# Patient Record
Sex: Male | Born: 1955 | Hispanic: No | Smoking: Former smoker
Health system: Southern US, Community
[De-identification: ages and names within clinical notes are randomized; demographics above are authoritative.]

## PROBLEM LIST (undated history)

## (undated) DIAGNOSIS — M1990 Unspecified osteoarthritis, unspecified site: Secondary | ICD-10-CM

## (undated) DIAGNOSIS — I447 Left bundle-branch block, unspecified: Secondary | ICD-10-CM

## (undated) HISTORY — PX: KNEE SURGERY: SHX244

## (undated) HISTORY — DX: Left bundle-branch block, unspecified: I44.7

## (undated) HISTORY — PX: ANKLE SURGERY: SHX546

## (undated) HISTORY — PX: OTHER SURGICAL HISTORY: SHX169

---

## 2013-02-02 ENCOUNTER — Other Ambulatory Visit: Payer: Self-pay | Admitting: Orthopedic Surgery

## 2013-02-13 ENCOUNTER — Encounter (HOSPITAL_COMMUNITY): Payer: Self-pay | Admitting: Pharmacy Technician

## 2013-02-17 NOTE — Pre-Procedure Instructions (Signed)
Johnny Brooks  02/17/2013   Your procedure is scheduled on:  December 1  Report to [facility] Entrance "A" [address] at [employer] AM.  Call this number if you have problems the morning of surgery: [phone]   Remember:   Do not eat food or drink liquids after midnight.   Take these medicines the morning of surgery with A SIP OF WATER: Flonase, Claritin   STOP Aspirin, CO Q10, Advil, Testosterone Supplement, Prostate Vitamin today   STOP/ Do not take Aspirin, Aleve, Naproxen, Advil, Ibuprofen, Vitamin, Herbs, and Supplements starting today   Do not wear jewelry, make-up or nail polish.  Do not wear lotions, powders, or perfumes. You may wear deodorant.  Do not shave 48 hours prior to surgery. Men may shave face and neck.  Do not bring valuables to the hospital.  [facility] is not responsible                  for any belongings or valuables.               Contacts, dentures or bridgework may not be worn into surgery.  Leave suitcase in the car. After surgery it may be brought to your room.  For patients admitted to the hospital, discharge time is determined by your                treatment team.               Special Instructions: Shower using CHG 2 nights before surgery and the night before surgery.  If you shower the day of surgery use CHG.  Use special wash - you have one bottle of CHG for all showers.  You should use approximately 1/3 of the bottle for each shower.   Please read over the following fact sheets that you were given: Pain Booklet, Coughing and Deep Breathing, Blood Transfusion Information, Total Joint Packet and Surgical Site Infection Prevention

## 2013-02-19 ENCOUNTER — Encounter (HOSPITAL_COMMUNITY): Payer: Self-pay

## 2013-02-19 ENCOUNTER — Encounter (HOSPITAL_COMMUNITY)
Admission: RE | Admit: 2013-02-19 | Discharge: 2013-02-19 | Disposition: A | Discharge status: Home / Self Care | Payer: 59 | Source: Ambulatory Visit | Attending: Orthopedic Surgery | Admitting: Orthopedic Surgery

## 2013-02-19 ENCOUNTER — Encounter (HOSPITAL_COMMUNITY)
Admission: RE | Admit: 2013-02-19 | Discharge: 2013-02-19 | Disposition: A | Discharge status: Home / Self Care | Payer: 59 | Source: Ambulatory Visit | Attending: Anesthesiology | Admitting: Anesthesiology

## 2013-02-19 HISTORY — DX: Unspecified osteoarthritis, unspecified site: M19.90

## 2013-02-19 LAB — BASIC METABOLIC PANEL
BUN: 10 mg/dL (ref 6–23)
Calcium: 8.9 mg/dL (ref 8.4–10.5)
Creatinine, Ser: 0.96 mg/dL (ref 0.50–1.35)
GFR calc Af Amer: >90 mL/min (ref >90)
GFR calc non Af Amer: 90 mL/min — ABNORMAL LOW (ref >90)
Glucose, Bld: 90 mg/dL (ref 70–99)
Sodium: 139 mEq/L (ref 135–145)

## 2013-02-19 LAB — CBC WITH DIFFERENTIAL/PLATELET
Basophils Relative: 1 % (ref 0–1)
Eosinophils Absolute: 0.1 10*3/uL (ref 0.0–0.7)
Hemoglobin: 13.3 g/dL (ref 13.0–17.0)
Lymphs Abs: 2.2 10*3/uL (ref 0.7–4.0)
MCH: 34 pg (ref 26.0–34.0)
MCHC: 35.6 g/dL (ref 30.0–36.0)
Monocytes Relative: 10 % (ref 3–12)
Neutro Abs: 2.4 10*3/uL (ref 1.7–7.7)
Neutrophils Relative %: 45 % (ref 43–77)
Platelets: 220 10*3/uL (ref 150–400)

## 2013-02-19 LAB — URINALYSIS, ROUTINE W REFLEX MICROSCOPIC
Bilirubin Urine: NEGATIVE
Glucose, UA: NEGATIVE mg/dL
Hgb urine dipstick: NEGATIVE
Leukocytes, UA: NEGATIVE
Specific Gravity, Urine: 1.016 (ref 1.005–1.030)
Urobilinogen, UA: 1 mg/dL (ref 0.0–1.0)

## 2013-02-19 LAB — SURGICAL PCR SCREEN
MRSA, PCR: NEGATIVE
Staphylococcus aureus: NEGATIVE

## 2013-02-19 LAB — PROTIME-INR: Prothrombin Time: 12.6 seconds (ref 11.6–15.2)

## 2013-02-19 LAB — ABO/RH: ABO/RH(D): A NEG

## 2013-02-19 NOTE — Progress Notes (Signed)
Left message for "Elease Hashimoto" at [place] [facility] [place]--[phone] to see if i can obtain earlier EKG for comparison.  DA

## 2013-02-19 NOTE — Progress Notes (Addendum)
Anesthesia Chart Review:  Patient is a 58 year old male scheduled for bilateral TKA on 02/26/13 by Dr. Turner Daniels.  History includes obesity (BMI 38), smoking, arthritis, right ankle surgery, left knee surgery '73 with prior arthroscopies, right dislocated clavicle. No documented HTN, but BP was 143/95 at PAT. OSA screening score was 4. PCP is Dr. Kirby Funk.    EKG on 02/19/13 showed NSR, LAD, left BBB.  There are no prior comparison EKGs in [place], Epic, or at Dr. Jone Baseman office.  CXR showed no acute abnormality seen.  Preoperative labs noted.  Cr 0.96, glucose 90, H/H 13.3/37.4, PT/PTT WNL.  Patient scheduled for bilateral TKA and has left BBB on unknown duration. He will need preoperative cardiology evaluation.  Discussed with anesthesiologist Dr. Noreene Larsson.  Agustin Cree notified at Dr. Wadie Lessen office.  She will work on getting cardiology appointment arranged.  Velna Ochs [facility] Short Stay Center/Anesthesiology Phone [phone] 02/19/2013 3:56 PM  Addendum: 02/21/2013 3:37 PM Patient was seen by cardiologist Dr. Allyson Sabal on 02/20/13.  He had a nuclear stress test today that showed: Normal stress nuclear study. LV Wall Motion: Incoordinate septal motion from LBBB. EF 61%. This was reviewed by Dr. Rennis Golden who cleared patient for upcoming knee surgery.

## 2013-02-19 NOTE — Progress Notes (Signed)
02/19/13 1030  OBSTRUCTIVE SLEEP APNEA  Have you ever been diagnosed with sleep apnea through a sleep study? No  Do you snore loudly (loud enough to be heard through closed doors)?  0  Do you often feel tired, fatigued, or sleepy during the daytime? 0  Has anyone observed you stop breathing during your sleep? 0  Do you have, or are you being treated for high blood pressure? 0  BMI more than 35 kg/m2? 1  Age over 58 years old? 1  Neck circumference greater than 40 cm/18 inches? 1  Gender: 1  Obstructive Sleep Apnea Score 4  Score 4 or greater  Results sent to PCP

## 2013-02-20 ENCOUNTER — Encounter: Payer: Self-pay | Admitting: Cardiovascular Disease

## 2013-02-20 ENCOUNTER — Ambulatory Visit (INDEPENDENT_AMBULATORY_CARE_PROVIDER_SITE_OTHER): Payer: 59 | Admitting: Cardiovascular Disease

## 2013-02-20 VITALS — BP 116/81 | HR 95 | Ht 72.0 in | Wt 282.0 lb

## 2013-02-20 DIAGNOSIS — Z01818 Encounter for other preprocedural examination: Secondary | ICD-10-CM

## 2013-02-20 DIAGNOSIS — I447 Left bundle-branch block, unspecified: Secondary | ICD-10-CM

## 2013-02-20 NOTE — Assessment & Plan Note (Signed)
Newly diagnosed. No prior EKG to compare. No critical factors. Scheduled for bilateral total knee replacements. I couldn't get any pharmacologic Myoview stress test to rule out ischemic etiology.

## 2013-02-20 NOTE — Progress Notes (Signed)
02/20/2013 [name]   07/23/1955  [id number]  Primary Physician Lillia Mountain, MD Primary Cardiologist: Runell Gess MD Roseanne Reno   HPI:  Mr. Johnny Brooks is a delightful 56 year old moderately overweight married Caucasian male father of 2, grandfather to 4 grandchildren he works as a Naval architect at [employer] . His primary care physician is Kirby Funk. He is scheduled for bilateral total knee replacements by Dr. Turner Daniels early next week. He has no cardiac risk factors  And is otherwise without symptoms. He he has had 5-6 knee surgeries in the past. During a preoperative evaluation he was noted to have a left bundle branch block and there are no EKGs with which to compare.   Current Outpatient Prescriptions  Medication Sig Dispense Refill  . aspirin EC 325 MG tablet Take 650 mg by mouth every 4 (four) hours as needed for mild pain.      . Coenzyme Q10 (CO Q-10 PO) Take 1 tablet by mouth daily.      . fluticasone (FLONASE) 50 MCG/ACT nasal spray Place 1 spray into both nostrils daily as needed for allergies or rhinitis.      Marland Kitchen ibuprofen (ADVIL,MOTRIN) 200 MG tablet Take 400 mg by mouth every 6 (six) hours as needed for fever, headache or moderate pain.      Marland Kitchen loratadine (CLARITIN) 10 MG tablet Take 10 mg by mouth daily as needed for allergies.      Marland Kitchen OVER THE COUNTER MEDICATION Take 2 capsules by mouth daily. Testosterone supplement      . Specialty Vitamins Products (PROSTATE PO) Take 1 tablet by mouth daily.      . TURMERIC PO Take by mouth.       No current facility-administered medications for this visit.    No Known Allergies  History   Social History  . Marital Status: Married    Spouse Name: N/A    Number of Children: N/A  . Years of Education: N/A   Occupational History  . Not on file.   Social History Main Topics  . Smoking status: Light Tobacco Smoker    Types: Cigars  . Smokeless tobacco: Not on file  . Alcohol Use: 1.8 oz/week    3  Cans of beer per week  . Drug Use: No  . Sexual Activity: Not on file   Other Topics Concern  . Not on file   Social History Narrative  . No narrative on file     Review of Systems: General: negative for chills, fever, night sweats or weight changes.  Cardiovascular: negative for chest pain, dyspnea on exertion, edema, orthopnea, palpitations, paroxysmal nocturnal dyspnea or shortness of breath Dermatological: negative for rash Respiratory: negative for cough or wheezing Urologic: negative for hematuria Abdominal: negative for nausea, vomiting, diarrhea, bright red blood per rectum, melena, or hematemesis Neurologic: negative for visual changes, syncope, or dizziness All other systems reviewed and are otherwise negative except as noted above.    Blood pressure 116/81, pulse 95, height 6' (1.829 m), weight 282 lb (127.914 kg).  General appearance: alert and no distress Neck: no adenopathy, no carotid bruit, no JVD, supple, symmetrical, trachea midline and thyroid not enlarged, symmetric, no tenderness/mass/nodules Lungs: clear to auscultation bilaterally Heart: regular rate and rhythm, S1, S2 normal, no murmur, click, rub or gallop Abdomen: soft, non-tender; bowel sounds normal; no masses,  no organomegaly Extremities: extremities normal, atraumatic, no cyanosis or edema Pulses: 2+ and symmetric  EKG not performed today outside EKG performed  yesterday revealed sinus rhythm at 81 with left bundle branch block  ASSESSMENT AND PLAN:   Left bundle branch block Newly diagnosed. No prior EKG to compare. No critical factors. Scheduled for bilateral total knee replacements. I couldn't get any pharmacologic Myoview stress test to rule out ischemic etiology.      Runell Gess MD FACP,FACC,FAHA, [facility] 02/20/2013 10:55 AM

## 2013-02-20 NOTE — Patient Instructions (Signed)
  We will see you back in follow up as needed with Dr Allyson Sabal  Dr Allyson Sabal has ordered a lexiscan myoview to be done tomorrow

## 2013-02-21 ENCOUNTER — Encounter (HOSPITAL_COMMUNITY): Payer: Self-pay

## 2013-02-21 ENCOUNTER — Telehealth: Payer: Self-pay | Admitting: *Deleted

## 2013-02-21 ENCOUNTER — Ambulatory Visit (HOSPITAL_COMMUNITY)
Admission: RE | Admit: 2013-02-21 | Discharge: 2013-02-21 | Disposition: A | Discharge status: Home / Self Care | Payer: 59 | Source: Ambulatory Visit | Attending: Cardiovascular Disease | Admitting: Cardiovascular Disease

## 2013-02-21 DIAGNOSIS — E663 Overweight: Secondary | ICD-10-CM | POA: Insufficient documentation

## 2013-02-21 DIAGNOSIS — R0989 Other specified symptoms and signs involving the circulatory and respiratory systems: Secondary | ICD-10-CM | POA: Insufficient documentation

## 2013-02-21 DIAGNOSIS — R0609 Other forms of dyspnea: Secondary | ICD-10-CM | POA: Insufficient documentation

## 2013-02-21 DIAGNOSIS — Z01818 Encounter for other preprocedural examination: Secondary | ICD-10-CM

## 2013-02-21 DIAGNOSIS — F17200 Nicotine dependence, unspecified, uncomplicated: Secondary | ICD-10-CM | POA: Insufficient documentation

## 2013-02-21 DIAGNOSIS — R9431 Abnormal electrocardiogram [ECG] [EKG]: Secondary | ICD-10-CM | POA: Insufficient documentation

## 2013-02-21 DIAGNOSIS — I447 Left bundle-branch block, unspecified: Secondary | ICD-10-CM

## 2013-02-21 MED ORDER — TECHNETIUM TC 99M SESTAMIBI GENERIC - CARDIOLITE
10.0000 | Freq: Once | INTRAVENOUS | Status: AC | PRN
  Administered 2013-02-21: 10 via INTRAVENOUS

## 2013-02-21 MED ORDER — TECHNETIUM TC 99M SESTAMIBI GENERIC - CARDIOLITE
30.0000 | Freq: Once | INTRAVENOUS | Status: AC | PRN
  Administered 2013-02-21: 30 via INTRAVENOUS

## 2013-02-21 MED ORDER — REGADENOSON 0.4 MG/5ML IV SOLN
0.4000 mg | Freq: Once | INTRAVENOUS | Status: AC
  Administered 2013-02-21: 0.4 mg via INTRAVENOUS

## 2013-02-21 NOTE — Procedures (Addendum)
[facility] [address] [place] [address] [place] [address] [phone]  Cardiology Nuclear Med Study  Johnny Brooks is a 57 y.o. male     MRN : [id number]     DOB: 04/20/1955  Procedure Date: 02/21/2013  Nuclear Med Background Indication for Stress Test:  Surgical Clearance and Abnormal EKG History:  Pt denies any prior cardiac or respiratory history. Cardiac Risk Factors: LBBB, Overweight and Smoker  Symptoms:  DOE   Nuclear Pre-Procedure Caffeine/Decaff Intake:  9:00pm NPO After: 7:00am   IV Site: R Hand  IV 0.9% NS with Angio Cath:  22g  Chest Size (in):  46"  IV Started by: Emmit Pomfret, RN  Height: 6' (1.829 m)  Cup Size: n/a  BMI:  Body mass index is 38.24 kg/(m^2). Weight:  282 lb (127.914 kg)   Tech Comments:  n/a    Nuclear Med Study 1 or 2 day study: 1 day  Stress Test Type:  Lexiscan  Order Authorizing Provider:  Obie Dredge   Resting Radionuclide: Technetium 54m Sestamibi  Resting Radionuclide Dose: 10.2 mCi   Stress Radionuclide:  Technetium 50m Sestamibi  Stress Radionuclide Dose: 30.7 mCi           Stress Protocol Rest HR: 81 Stress HR: 106  Rest BP: 146/92 Stress BP: 152/90  Exercise Time (min): n/a METS: n/a   Predicted Max HR: 162 bpm % Max HR: 66.67 bpm Rate Pressure Product: 21308  Dose of Adenosine (mg):  n/a Dose of Lexiscan: 0.4 mg  Dose of Atropine (mg): n/a Dose of Dobutamine: n/a mcg/kg/min (at max HR)  Stress Test Technologist: Esperanza Sheets, CCT Nuclear Technologist: Koren Shiver, CNMT   Rest Procedure:  Myocardial perfusion imaging was performed at rest 45 minutes following the intravenous administration of Technetium 73m Sestamibi. Stress Procedure:  The patient received IV Lexiscan 0.4 mg over 15-seconds.  Technetium 63m Sestamibi injected at 30-seconds.  There were no significant changes with Lexiscan.  Quantitative spect images were obtained after a 45 minute  delay.  Transient Ischemic Dilatation (Normal <1.22):  1.00 Lung/Heart Ratio (Normal <0.45):  0.30 QGS EDV:  90 ml QGS ESV:  35 ml LV Ejection Fraction: 61%  Rest ECG: NSR-LBBB  Stress ECG: No significant change from baseline ECG  QPS Raw Data Images:  Normal; no motion artifact; normal heart/lung ratio. Stress Images:  Normal homogeneous uptake in all areas of the myocardium. Rest Images:  Normal homogeneous uptake in all areas of the myocardium. Subtraction (SDS):  No evidence of ischemia.  Impression Exercise Capacity:  Lexiscan with no exercise. BP Response:  Normal blood pressure response. Clinical Symptoms:  No symptoms. ECG Impression:  No significant ECG changes with Lexiscan. Comparison with Prior Nuclear Study: No previous nuclear study performed  Overall Impression:  Normal stress nuclear study.  LV Wall Motion:  Incoordinate septal motion from LBBB. EF 61%.  Chrystie Nose, MD, [facility] Board Certified in Nuclear Cardiology Attending Cardiologist [facility]  Chrystie Nose, MD  02/21/2013 12:41 PM

## 2013-02-21 NOTE — Telephone Encounter (Signed)
Dr Rennis Golden read the [facility] and cleared patient for upcoming knee surgery on Monday.  Patient is aware and clearance letter was faxed.

## 2013-02-21 NOTE — H&P (Signed)
TOTAL KNEE ADMISSION H&P  Patient is being admitted for bilaterally total knee arthroplasty.  Subjective:  Chief Complaint:bilaterally knee pain.  HPI: Johnny Brooks, 57 y.o. male, has a history of pain and functional disability in the bilaterally knee due to arthritis and has failed non-surgical conservative treatments for greater than 12 weeks to includeNSAID's and/or analgesics, corticosteriod injections, flexibility and strengthening excercises and activity modification.  Onset of symptoms was gradual, starting several years ago with gradually worsening course since that time. The patient noted no past surgery on the bilaterally knee(s).  Patient currently rates pain in the bilaterally knee(s) at 10 out of 10 with activity. Patient has night pain, worsening of pain with activity and weight bearing, pain with passive range of motion and joint swelling.  Patient has evidence of joint space narrowing by imaging studies. There is no active infection.  Patient Active Problem List   Diagnosis Date Noted  . Left bundle branch block 02/20/2013   Past Medical History  Diagnosis Date  . Arthritis     knees  . Left bundle branch block     Past Surgical History  Procedure Laterality Date  . Knee surgery      left  1973  . Arthroscopies      numerous on knees  . Dislocated clavicle      right  . Ankle surgery      right    No prescriptions prior to admission   No Known Allergies  History  Substance Use Topics  . Smoking status: Light Tobacco Smoker    Types: Cigars  . Smokeless tobacco: Not on file  . Alcohol Use: 1.8 oz/week    3 Cans of beer per week    No family history on file.   Review of Systems  Constitutional: Negative.   HENT: Negative.   Eyes: Negative.   Respiratory: Negative.   Cardiovascular: Negative.   Gastrointestinal: Negative.   Genitourinary: Negative.   Musculoskeletal: Positive for joint pain.  Skin: Negative.   Neurological: Negative.    Endo/Heme/Allergies: Negative.   Psychiatric/Behavioral: Negative.     Objective:  Physical Exam  Constitutional: He is oriented to person, place, and time. He appears well-developed and well-nourished.  HENT:  Head: Normocephalic and atraumatic.  Eyes: Pupils are equal, round, and reactive to light.  Neck: Normal range of motion. Neck supple.  Cardiovascular: Intact distal pulses.   Respiratory: Effort normal.  Musculoskeletal:  Patient has good strength bilaterally in his knees.  He does have reduced range of motion from approximately 5 to 120 bilaterally.  Patient does have increased medial joint line tenderness and mild peripatellar pain bilaterally.  Obvious crepitus with range of motion.  No noticeable instability.  Patient's calves are soft and nontender.  He is neurovascularly intact distally.  Neurological: He is alert and oriented to person, place, and time.  Skin: Skin is warm and dry.  Psychiatric: He has a normal mood and affect. His behavior is normal. Judgment and thought content normal.    Vital signs in last 24 hours: Weight:  [127.914 kg (282 lb)] 127.914 kg (282 lb) (11/26 0943)  Labs:   There is no height or weight on file to calculate BMI.   Imaging Review X-rays are reviewed in office today and show bilateral medial joint space collapse with near bone-on-bone arthritis.  Patient also has obvious patellofemoral arthritis with spurring.  Assessment/Plan:  End stage arthritis, bilaterally knee   The patient history, physical examination, clinical judgment of the  provider and imaging studies are consistent with end stage degenerative joint disease of the bilaterally knee(s) and total knee arthroplasty is deemed medically necessary. The treatment options including medical management, injection therapy arthroscopy and arthroplasty were discussed at length. The risks and benefits of total knee arthroplasty were presented and reviewed. The risks due to aseptic  loosening, infection, stiffness, patella tracking problems, thromboembolic complications and other imponderables were discussed. The patient acknowledged the explanation, agreed to proceed with the plan and consent was signed. Patient is being admitted for inpatient treatment for surgery, pain control, PT, OT, prophylactic antibiotics, VTE prophylaxis, progressive ambulation and ADL's and discharge planning. The patient is planning to be discharged to skilled nursing facility

## 2013-02-25 MED ORDER — DEXTROSE 5 % IV SOLN
3.0000 g | INTRAVENOUS | Status: AC
  Administered 2013-02-26 (×2): 3 g via INTRAVENOUS
  Filled 2013-02-25: qty 3000

## 2013-02-26 ENCOUNTER — Encounter (HOSPITAL_COMMUNITY)
Admission: RE | Disposition: A | Discharge status: Skilled Nursing Facility | Payer: Self-pay | Source: Ambulatory Visit | Attending: Orthopedic Surgery

## 2013-02-26 ENCOUNTER — Encounter (HOSPITAL_COMMUNITY): Payer: 59 | Admitting: Vascular Surgery

## 2013-02-26 ENCOUNTER — Inpatient Hospital Stay (HOSPITAL_COMMUNITY): Payer: 59 | Admitting: Anesthesiology

## 2013-02-26 ENCOUNTER — Encounter (HOSPITAL_COMMUNITY): Payer: Self-pay | Admitting: *Deleted

## 2013-02-26 ENCOUNTER — Inpatient Hospital Stay (HOSPITAL_COMMUNITY)
Admission: RE | Admit: 2013-02-26 | Discharge: 2013-03-02 | DRG: 462 | Disposition: A | Discharge status: Skilled Nursing Facility | Payer: 59 | Source: Ambulatory Visit | Attending: Orthopedic Surgery | Admitting: Orthopedic Surgery

## 2013-02-26 DIAGNOSIS — I447 Left bundle-branch block, unspecified: Secondary | ICD-10-CM | POA: Diagnosis present

## 2013-02-26 DIAGNOSIS — D62 Acute posthemorrhagic anemia: Secondary | ICD-10-CM | POA: Diagnosis not present

## 2013-02-26 DIAGNOSIS — M1710 Unilateral primary osteoarthritis, unspecified knee: Principal | ICD-10-CM | POA: Diagnosis present

## 2013-02-26 DIAGNOSIS — E669 Obesity, unspecified: Secondary | ICD-10-CM | POA: Diagnosis present

## 2013-02-26 DIAGNOSIS — Z6838 Body mass index (BMI) 38.0-38.9, adult: Secondary | ICD-10-CM

## 2013-02-26 DIAGNOSIS — F17200 Nicotine dependence, unspecified, uncomplicated: Secondary | ICD-10-CM | POA: Diagnosis present

## 2013-02-26 HISTORY — PX: TOTAL KNEE ARTHROPLASTY: SHX125

## 2013-02-26 SURGERY — ARTHROPLASTY, KNEE, BILATERAL, TOTAL
Anesthesia: General | Site: Knee | Laterality: Bilateral | Wound class: Clean

## 2013-02-26 MED ORDER — MENTHOL 3 MG MT LOZG: 1.0000 | LOZENGE | OROMUCOSAL | Status: DC | PRN

## 2013-02-26 MED ORDER — NEOSTIGMINE METHYLSULFATE 1 MG/ML IJ SOLN
INTRAMUSCULAR | Status: DC | PRN
  Administered 2013-02-26: 5 mg via INTRAVENOUS

## 2013-02-26 MED ORDER — METOCLOPRAMIDE HCL 5 MG/ML IJ SOLN: 5.0000 mg | Freq: Three times a day (TID) | INTRAMUSCULAR | Status: DC | PRN

## 2013-02-26 MED ORDER — LIDOCAINE HCL (CARDIAC) 20 MG/ML IV SOLN
INTRAVENOUS | Status: DC | PRN
  Administered 2013-02-26: 100 mg via INTRAVENOUS

## 2013-02-26 MED ORDER — OXYCODONE HCL 5 MG PO TABS
5.0000 mg | ORAL_TABLET | ORAL | Status: DC | PRN
  Administered 2013-02-26 (×2): 10 mg via ORAL
  Administered 2013-02-26: 5 mg via ORAL
  Administered 2013-02-27 – 2013-03-02 (×19): 10 mg via ORAL
  Filled 2013-02-26 (×24): qty 2

## 2013-02-26 MED ORDER — ACETAMINOPHEN 650 MG RE SUPP: 650.0000 mg | Freq: Four times a day (QID) | RECTAL | Status: DC | PRN

## 2013-02-26 MED ORDER — MAGNESIUM CITRATE PO SOLN: 1.0000 | Freq: Once | ORAL | Status: AC | PRN

## 2013-02-26 MED ORDER — HYDROMORPHONE HCL PF 1 MG/ML IJ SOLN
1.0000 mg | INTRAMUSCULAR | Status: DC | PRN
  Administered 2013-02-26 – 2013-03-01 (×14): 1 mg via INTRAVENOUS
  Filled 2013-02-26 (×14): qty 1

## 2013-02-26 MED ORDER — FENTANYL CITRATE 0.05 MG/ML IJ SOLN
INTRAMUSCULAR | Status: DC | PRN
  Administered 2013-02-26 (×15): 50 ug via INTRAVENOUS

## 2013-02-26 MED ORDER — GLYCOPYRROLATE 0.2 MG/ML IJ SOLN
INTRAMUSCULAR | Status: DC | PRN
  Administered 2013-02-26: .8 mg via INTRAVENOUS

## 2013-02-26 MED ORDER — KCL IN DEXTROSE-NACL 20-5-0.45 MEQ/L-%-% IV SOLN
INTRAVENOUS | Status: AC
  Filled 2013-02-26: qty 1000

## 2013-02-26 MED ORDER — BUPIVACAINE LIPOSOME 1.3 % IJ SUSP
20.0000 mL | Freq: Once | INTRAMUSCULAR | Status: DC
  Filled 2013-02-26: qty 20

## 2013-02-26 MED ORDER — FENTANYL CITRATE 0.05 MG/ML IJ SOLN
INTRAMUSCULAR | Status: AC
  Filled 2013-02-26: qty 2

## 2013-02-26 MED ORDER — OXYCODONE HCL 5 MG/5ML PO SOLN
5.0000 mg | Freq: Once | ORAL | Status: DC | PRN
Start: 2013-02-26 — End: 2013-02-26

## 2013-02-26 MED ORDER — SODIUM CHLORIDE 0.9 % IR SOLN
Status: DC | PRN
  Administered 2013-02-26: 3000 mL
  Administered 2013-02-26: 1000 mL

## 2013-02-26 MED ORDER — DEXAMETHASONE SODIUM PHOSPHATE 10 MG/ML IJ SOLN
INTRAMUSCULAR | Status: DC | PRN
  Administered 2013-02-26: 8 mg via INTRAVENOUS

## 2013-02-26 MED ORDER — METOCLOPRAMIDE HCL 10 MG PO TABS: 5.0000 mg | ORAL_TABLET | Freq: Three times a day (TID) | ORAL | Status: DC | PRN

## 2013-02-26 MED ORDER — ONDANSETRON HCL 4 MG/2ML IJ SOLN
INTRAMUSCULAR | Status: DC | PRN
  Administered 2013-02-26 (×2): 4 mg via INTRAVENOUS

## 2013-02-26 MED ORDER — CEFUROXIME SODIUM 1.5 G IJ SOLR
INTRAMUSCULAR | Status: DC | PRN
  Administered 2013-02-26: 3 g

## 2013-02-26 MED ORDER — LORATADINE 10 MG PO TABS
10.0000 mg | ORAL_TABLET | Freq: Every day | ORAL | Status: DC | PRN
  Filled 2013-02-26: qty 1

## 2013-02-26 MED ORDER — ONDANSETRON HCL 4 MG PO TABS: 4.0000 mg | ORAL_TABLET | Freq: Four times a day (QID) | ORAL | Status: DC | PRN

## 2013-02-26 MED ORDER — LIDOCAINE HCL 4 % MT SOLN
OROMUCOSAL | Status: DC | PRN
  Administered 2013-02-26: 4 mL via TOPICAL

## 2013-02-26 MED ORDER — KCL IN DEXTROSE-NACL 20-5-0.45 MEQ/L-%-% IV SOLN
INTRAVENOUS | Status: DC
  Administered 2013-02-26: 17:00:00 via INTRAVENOUS
  Administered 2013-02-27: 1 mL via INTRAVENOUS
  Filled 2013-02-26 (×14): qty 1000

## 2013-02-26 MED ORDER — LACTATED RINGERS IV SOLN
INTRAVENOUS | Status: DC | PRN
  Administered 2013-02-26 (×3): via INTRAVENOUS

## 2013-02-26 MED ORDER — PROPOFOL 10 MG/ML IV BOLUS
INTRAVENOUS | Status: DC | PRN
  Administered 2013-02-26 (×3): 10 mg via INTRAVENOUS
  Administered 2013-02-26: 30 mg via INTRAVENOUS
  Administered 2013-02-26: 70 mg via INTRAVENOUS
  Administered 2013-02-26: 10 mg via INTRAVENOUS
  Administered 2013-02-26: 200 mg via INTRAVENOUS
  Administered 2013-02-26: 10 mg via INTRAVENOUS

## 2013-02-26 MED ORDER — FENTANYL CITRATE 0.05 MG/ML IJ SOLN
25.0000 ug | INTRAMUSCULAR | Status: DC | PRN
  Administered 2013-02-26 (×3): 50 ug via INTRAVENOUS

## 2013-02-26 MED ORDER — FLUTICASONE PROPIONATE 50 MCG/ACT NA SUSP: 1.0000 | Freq: Every day | NASAL | Status: DC | PRN

## 2013-02-26 MED ORDER — ROCURONIUM BROMIDE 100 MG/10ML IV SOLN
INTRAVENOUS | Status: DC | PRN
  Administered 2013-02-26: 10 mg via INTRAVENOUS
  Administered 2013-02-26: 50 mg via INTRAVENOUS
  Administered 2013-02-26 (×2): 20 mg via INTRAVENOUS

## 2013-02-26 MED ORDER — ASPIRIN EC 325 MG PO TBEC: 325.0000 mg | DELAYED_RELEASE_TABLET | Freq: Two times a day (BID) | ORAL | Status: DC

## 2013-02-26 MED ORDER — CEFAZOLIN SODIUM 1-5 GM-% IV SOLN
INTRAVENOUS | Status: AC
  Filled 2013-02-26: qty 50

## 2013-02-26 MED ORDER — SENNOSIDES-DOCUSATE SODIUM 8.6-50 MG PO TABS: 1.0000 | ORAL_TABLET | Freq: Every evening | ORAL | Status: DC | PRN

## 2013-02-26 MED ORDER — METHOCARBAMOL 100 MG/ML IJ SOLN
500.0000 mg | Freq: Four times a day (QID) | INTRAMUSCULAR | Status: DC | PRN
  Filled 2013-02-26: qty 5

## 2013-02-26 MED ORDER — OXYCODONE HCL 5 MG PO TABS: 5.0000 mg | ORAL_TABLET | Freq: Once | ORAL | Status: DC | PRN

## 2013-02-26 MED ORDER — DOCUSATE SODIUM 100 MG PO CAPS
100.0000 mg | ORAL_CAPSULE | Freq: Two times a day (BID) | ORAL | Status: DC
  Administered 2013-02-26 – 2013-03-02 (×9): 100 mg via ORAL
  Filled 2013-02-26 (×9): qty 1

## 2013-02-26 MED ORDER — ONDANSETRON HCL 4 MG/2ML IJ SOLN: 4.0000 mg | Freq: Four times a day (QID) | INTRAMUSCULAR | Status: DC | PRN

## 2013-02-26 MED ORDER — BISACODYL 5 MG PO TBEC: 5.0000 mg | DELAYED_RELEASE_TABLET | Freq: Every day | ORAL | Status: DC | PRN

## 2013-02-26 MED ORDER — PHENOL 1.4 % MT LIQD: 1.0000 | OROMUCOSAL | Status: DC | PRN

## 2013-02-26 MED ORDER — DEXTROSE-NACL 5-0.45 % IV SOLN: INTRAVENOUS | Status: DC

## 2013-02-26 MED ORDER — OXYCODONE-ACETAMINOPHEN 5-325 MG PO TABS: 1.0000 | ORAL_TABLET | ORAL | Status: DC | PRN

## 2013-02-26 MED ORDER — CHLORHEXIDINE GLUCONATE 4 % EX LIQD: 60.0000 mL | Freq: Once | CUTANEOUS | Status: DC

## 2013-02-26 MED ORDER — MIDAZOLAM HCL 5 MG/5ML IJ SOLN
INTRAMUSCULAR | Status: DC | PRN
  Administered 2013-02-26: 2 mg via INTRAVENOUS

## 2013-02-26 MED ORDER — ASPIRIN EC 325 MG PO TBEC
325.0000 mg | DELAYED_RELEASE_TABLET | Freq: Every day | ORAL | Status: DC
  Administered 2013-02-27 – 2013-03-02 (×4): 325 mg via ORAL
  Filled 2013-02-26 (×5): qty 1

## 2013-02-26 MED ORDER — CEFUROXIME SODIUM 1.5 G IJ SOLR
INTRAMUSCULAR | Status: AC
  Filled 2013-02-26: qty 3

## 2013-02-26 MED ORDER — ARTIFICIAL TEARS OP OINT
TOPICAL_OINTMENT | OPHTHALMIC | Status: DC | PRN
  Administered 2013-02-26: 1 via OPHTHALMIC

## 2013-02-26 MED ORDER — METHOCARBAMOL 500 MG PO TABS
500.0000 mg | ORAL_TABLET | Freq: Four times a day (QID) | ORAL | Status: DC | PRN
  Administered 2013-02-26 – 2013-03-02 (×10): 500 mg via ORAL
  Filled 2013-02-26 (×10): qty 1

## 2013-02-26 MED ORDER — SODIUM CHLORIDE 0.9 % IJ SOLN
INTRAMUSCULAR | Status: DC | PRN
  Administered 2013-02-26: 09:00:00

## 2013-02-26 MED ORDER — ACETAMINOPHEN 325 MG PO TABS
650.0000 mg | ORAL_TABLET | Freq: Four times a day (QID) | ORAL | Status: DC | PRN
  Administered 2013-02-28: 650 mg via ORAL
  Filled 2013-02-26: qty 2

## 2013-02-26 MED ORDER — HYDROMORPHONE HCL PF 1 MG/ML IJ SOLN
INTRAMUSCULAR | Status: AC
  Administered 2013-02-26: 1 mg via INTRAVENOUS
  Filled 2013-02-26: qty 1

## 2013-02-26 MED ORDER — ALUM & MAG HYDROXIDE-SIMETH 200-200-20 MG/5ML PO SUSP: 30.0000 mL | ORAL | Status: DC | PRN

## 2013-02-26 MED ORDER — METHOCARBAMOL 500 MG PO TABS: 500.0000 mg | ORAL_TABLET | Freq: Two times a day (BID) | ORAL | Status: DC

## 2013-02-26 MED ORDER — TRANEXAMIC ACID 100 MG/ML IV SOLN
1000.0000 mg | INTRAVENOUS | Status: AC
  Administered 2013-02-26: 1000 mg via INTRAVENOUS
  Filled 2013-02-26: qty 10

## 2013-02-26 MED ORDER — DIPHENHYDRAMINE HCL 12.5 MG/5ML PO ELIX: 12.5000 mg | ORAL_SOLUTION | ORAL | Status: DC | PRN

## 2013-02-26 MED ORDER — CEFAZOLIN SODIUM-DEXTROSE 2-3 GM-% IV SOLR
INTRAVENOUS | Status: AC
  Filled 2013-02-26: qty 50

## 2013-02-26 MED ORDER — ONDANSETRON HCL 4 MG/2ML IJ SOLN
4.0000 mg | Freq: Four times a day (QID) | INTRAMUSCULAR | Status: DC | PRN
  Filled 2013-02-26: qty 2

## 2013-02-26 SURGICAL SUPPLY — 60 items
BANDAGE ELASTIC 4 VELCRO ST LF (GAUZE/BANDAGES/DRESSINGS) ×4 IMPLANT
BANDAGE ELASTIC 6 VELCRO ST LF (GAUZE/BANDAGES/DRESSINGS) ×2 IMPLANT
BANDAGE ESMARK 6X9 LF (GAUZE/BANDAGES/DRESSINGS) ×1 IMPLANT
BLADE SAG 18X100X1.27 (BLADE) ×4 IMPLANT
BLADE SAW SGTL 13X75X1.27 (BLADE) ×4 IMPLANT
BLADE SURG ROTATE 9660 (MISCELLANEOUS) ×2 IMPLANT
BNDG COHESIVE 4X5 TAN NS LF (GAUZE/BANDAGES/DRESSINGS) ×2 IMPLANT
BNDG ESMARK 6X9 LF (GAUZE/BANDAGES/DRESSINGS) ×2
BOWL SMART MIX CTS (DISPOSABLE) ×4 IMPLANT
CAPT RP KNEE ×4 IMPLANT
CEMENT HV SMART SET (Cement) ×8 IMPLANT
CLOTH BEACON ORANGE TIMEOUT ST (SAFETY) ×2 IMPLANT
COVER BACK TABLE 24X17X13 BIG (DRAPES) IMPLANT
COVER SURGICAL LIGHT HANDLE (MISCELLANEOUS) ×4 IMPLANT
CUFF TOURNIQUET SINGLE 34IN LL (TOURNIQUET CUFF) ×4 IMPLANT
CUFF TOURNIQUET SINGLE 44IN (TOURNIQUET CUFF) IMPLANT
DRAPE EXTREMITY BILATERAL (DRAPE) ×2 IMPLANT
DRAPE INCISE IOBAN 66X45 STRL (DRAPES) ×4 IMPLANT
DRAPE U-SHAPE 47X51 STRL (DRAPES) ×6 IMPLANT
DURAPREP 26ML APPLICATOR (WOUND CARE) ×8 IMPLANT
ELECT REM PT RETURN 9FT ADLT (ELECTROSURGICAL) ×2
ELECTRODE REM PT RTRN 9FT ADLT (ELECTROSURGICAL) ×1 IMPLANT
EVACUATOR 1/8 PVC DRAIN (DRAIN) ×4 IMPLANT
GAUZE XEROFORM 1X8 LF (GAUZE/BANDAGES/DRESSINGS) ×2 IMPLANT
GLOVE BIO SURGEON STRL SZ7.5 (GLOVE) ×4 IMPLANT
GLOVE BIO SURGEON STRL SZ8.5 (GLOVE) ×4 IMPLANT
GLOVE BIOGEL PI IND STRL 8 (GLOVE) ×2 IMPLANT
GLOVE BIOGEL PI IND STRL 9 (GLOVE) ×1 IMPLANT
GLOVE BIOGEL PI INDICATOR 8 (GLOVE) ×2
GLOVE BIOGEL PI INDICATOR 9 (GLOVE) ×1
GOWN STRL NON-REIN LRG LVL3 (GOWN DISPOSABLE) IMPLANT
GOWN STRL REIN XL XLG (GOWN DISPOSABLE) ×8 IMPLANT
HANDPIECE INTERPULSE COAX TIP (DISPOSABLE) ×1
HOOD PEEL AWAY FACE SHEILD DIS (HOOD) ×6 IMPLANT
KIT BASIN OR (CUSTOM PROCEDURE TRAY) ×2 IMPLANT
KIT ROOM TURNOVER OR (KITS) ×2 IMPLANT
MANIFOLD NEPTUNE II (INSTRUMENTS) ×2 IMPLANT
NEEDLE SPNL 18GX3.5 QUINCKE PK (NEEDLE) ×2 IMPLANT
NS IRRIG 1000ML POUR BTL (IV SOLUTION) ×2 IMPLANT
PACK TOTAL JOINT (CUSTOM PROCEDURE TRAY) ×2 IMPLANT
PAD ARMBOARD 7.5X6 YLW CONV (MISCELLANEOUS) ×4 IMPLANT
PAD CAST 4YDX4 CTTN HI CHSV (CAST SUPPLIES) ×2 IMPLANT
PADDING CAST COTTON 4X4 STRL (CAST SUPPLIES) ×2
PADDING CAST COTTON 6X4 STRL (CAST SUPPLIES) ×2 IMPLANT
SET HNDPC FAN SPRY TIP SCT (DISPOSABLE) ×1 IMPLANT
SPONGE GAUZE 4X4 12PLY (GAUZE/BANDAGES/DRESSINGS) ×2 IMPLANT
STAPLER VISISTAT 35W (STAPLE) ×4 IMPLANT
STOCKINETTE IMPERVIOUS 9X36 MD (GAUZE/BANDAGES/DRESSINGS) ×4 IMPLANT
SUCTION FRAZIER TIP 10 FR DISP (SUCTIONS) ×2 IMPLANT
SUT VIC AB 0 CT1 27 (SUTURE) ×2
SUT VIC AB 0 CT1 27XBRD ANBCTR (SUTURE) ×2 IMPLANT
SUT VIC AB 1 CTX 36 (SUTURE) ×2
SUT VIC AB 1 CTX36XBRD ANBCTR (SUTURE) ×2 IMPLANT
SUT VIC AB 2-0 CT1 27 (SUTURE) ×2
SUT VIC AB 2-0 CT1 TAPERPNT 27 (SUTURE) ×2 IMPLANT
SYR 50ML LL SCALE MARK (SYRINGE) ×2 IMPLANT
TOWEL OR 17X24 6PK STRL BLUE (TOWEL DISPOSABLE) ×2 IMPLANT
TOWEL OR 17X26 10 PK STRL BLUE (TOWEL DISPOSABLE) ×2 IMPLANT
TRAY FOLEY CATH 14FR (SET/KITS/TRAYS/PACK) ×2 IMPLANT
WATER STERILE IRR 1000ML POUR (IV SOLUTION) ×2 IMPLANT

## 2013-02-26 NOTE — Interval H&P Note (Signed)
History and Physical Interval Note:  02/26/2013 7:10 AM  Johnny Brooks  has presented today for surgery, with the diagnosis of BILATERAL KNEE OSTEOARTHRITIS  The various methods of treatment have been discussed with the patient and family. After consideration of risks, benefits and other options for treatment, the patient has consented to  Procedure(s): TOTAL KNEE BILATERAL (Bilateral) as a surgical intervention .  The patient's history has been reviewed, patient examined, no change in status, stable for surgery.  I have reviewed the patient's chart and labs.  Questions were answered to the patient's satisfaction.     Nestor Lewandowsky

## 2013-02-26 NOTE — Op Note (Signed)
PATIENT ID:      Johnny Brooks  MRN:     [id number] DOB/AGE:    1956/02/19 / 57 y.o.       OPERATIVE REPORT    DATE OF PROCEDURE:  02/26/2013       PREOPERATIVE DIAGNOSIS:   BILATERAL KNEE OSTEOARTHRITIS      There is no weight on file to calculate BMI.                                                        POSTOPERATIVE DIAGNOSIS:   BILATERAL KNEE OSTEOARTHRITIS                                                                      PROCEDURE:  Procedure(s): TOTAL KNEE BILATERAL Using Depuy Sigma RP implantsB #5 Femur, B #5Tibia, B 10mm sigma RP bearing, B 41 Patella     SURGEON: Mckenzye Cutright J    ASSISTANT:   Eric K. [employer]   (Present and scrubbed throughout the case, critical for assistance with exposure, retraction, instrumentation, and closure.)         ANESTHESIA: GET Exparel  DRAINS: foley, 2 medium hemovac in knee   TOURNIQUET TIME: R, L   COMPLICATIONS:  None     SPECIMENS: None   INDICATIONS FOR PROCEDURE: The patient has  BILATERAL KNEE OSTEOARTHRITIS, varus deformities, XR shows bone on bone arthritis, lat subluxation of tibia. Patient has failed all conservative measures including anti-inflammatory medicines, narcotics, attempts at  exercise and weight loss, cortisone injections and viscosupplementation.  Risks and benefits of surgery have been discussed, questions answered. R knee is first  DESCRIPTION OF PROCEDURE: The patient identified by armband, received  IV antibiotics, in the holding area at [facility]. Patient taken to the operating room, appropriate anesthetic  monitors were attached, and general endotracheal anesthesia induced with  the patient in supine position, Foley catheter was inserted. Tourniquet  applied high to the operative thigh. B Lateral post and foot positioner  applied to the table, B lower extremity was then prepped and draped  in usual sterile fashion from the ankle to the tourniquet. Time-out procedure was performed.  The R limb was wrapped with an Esmarch bandage and the tourniquet inflated to 350 mmHg. We began the operation by making the anterior midline incision starting at handbreadth above the patella going over the patella 1 cm medial to and  4 cm distal to the tibial tubercle. Small bleeders in the skin and the  subcutaneous tissue identified and cauterized. Transverse retinaculum was incised and reflected medially and a medial parapatellar arthrotomy was accomplished. the patella was everted and theprepatellar fat pad resected. The superficial medial collateral  ligament was then elevated from anterior to posterior along the proximal  flare of the tibia and anterior half of the menisci resected. The knee was hyperflexed exposing bone on bone arthritis. Peripheral and notch osteophytes as well as the cruciate ligaments were then resected. We continued to  work our way around posteriorly along the proximal tibia, and externally  rotated the tibia  subluxing it out from underneath the femur. A McHale  retractor was placed through the notch and a lateral Hohmann retractor  placed, and we then drilled through the proximal tibia in line with the  axis of the tibia followed by an intramedullary guide rod and 2-degree  posterior slope cutting guide. The tibial cutting guide was pinned into place  allowing resection of 6 mm of bone medially and about 13 mm of bone  laterally because of her varus deformity. Satisfied with the tibial resection, we then  entered the distal femur 2 mm anterior to the PCL origin with the  intramedullary guide rod and applied the distal femoral cutting guide  set at 11mm, with 5 degrees of valgus. This was pinned along the  epicondylar axis. At this point, the distal femoral cut was accomplished without difficulty. We then sized for a #5R femoral component and pinned the guide in 3 degrees of external rotation.The chamfer cutting guide was pinned into place. The anterior, posterior, and  chamfer cuts were accomplished without difficulty followed by  the Sigma RP box cutting guide and the box cut. We also removed posterior osteophytes from the posterior femoral condyles. At this  time, the knee was brought into full extension. We checked our  extension and flexion gaps and found them symmetric at 10mm.  The patella thickness measured at 28 mm. We set the cutting guide at 17 and removed the posterior 11 mm  of the patella, sized for a 41 button and drilled the lollipop. The knee  was then once again hyperflexed exposing the proximal tibia. We sized for a #5 tibial base plate, applied the smokestack and the conical reamer followed by the the Delta fin keel punch. We then hammered into place the Sigma RP trial femoral component, inserted a 10-mm trial bearing, trial patellar button, and took the knee through range of motion from 0-130 degrees. No thumb pressure was required for patellar  tracking. At this point, all trial components were removed, a double batch of DePuy HV cement with 1500 mg of Zinacef was mixed and applied to all bony metallic mating surfaces except for the posterior condyles of the femur itself. In order, we  hammered into place the tibial tray and removed excess cement, the femoral component and removed excess cement, a 10-mm Sigma RP bearing  was inserted, and the knee brought to full extension with compression.  The patellar button was clamped into place, and excess cement  removed. While the cement cured the wound was irrigated out with normal saline solution pulse lavage, and medium Hemovac drains were placed from an anterolateral  approach. Ligament stability and patellar tracking were checked and found to be excellent. The parapatellar arthrotomy was closed with  running #1 Vicryl suture. The subcutaneous tissue with 0 and 2-0 undyed  Vicryl suture, and the skin with skin staples. A dressing of Xeroform,  4 x 4, dressing sponges, Webril, and Ace wrap applied.  The L limb was wrapped with an Esmarch bandage and the tourniquet inflated to 350 mmHg. We began the operation by making the anterior midline incision starting at handbreadth above the patella going over the patella 1 cm medial to and  4 cm distal to the tibial tubercle. Small bleeders in the skin and the  subcutaneous tissue identified and cauterized. Transverse retinaculum was incised and reflected medially and a medial parapatellar arthrotomy was accomplished. the patella was everted and theprepatellar fat pad resected. The superficial medial collateral  ligament was then  elevated from anterior to posterior along the proximal  flare of the tibia and anterior half of the menisci resected. The knee was hyperflexed exposing bone on bone arthritis. Peripheral and notch osteophytes as well as the cruciate ligaments were then resected. We continued to  work our way around posteriorly along the proximal tibia, and externally  rotated the tibia subluxing it out from underneath the femur. A McHale  retractor was placed through the notch and a lateral Hohmann retractor  placed, and we then drilled through the proximal tibia in line with the  axis of the tibia followed by an intramedullary guide rod and 2-degree  posterior slope cutting guide. The tibial cutting guide was pinned into place  allowing resection of 6 mm of bone medially and about 12 mm of bone  laterally because of her varus deformity. Satisfied with the tibial resection, we then  entered the distal femur 2 mm anterior to the PCL origin with the  intramedullary guide rod and applied the distal femoral cutting guide  set at 11mm, with 5 degrees of valgus. This was pinned along the  epicondylar axis. At this point, the distal femoral cut was accomplished without difficulty. We then sized for a #5L femoral component and pinned the guide in 3 degrees of external rotation.The chamfer cutting guide was pinned into place. The anterior, posterior, and  chamfer cuts were accomplished without difficulty followed by  the Sigma RP box cutting guide and the box cut. We also removed posterior osteophytes from the posterior femoral condyles. At this  time, the knee was brought into full extension. We checked our  extension and flexion gaps and found them symmetric at 10mm.  The patella thickness measured at 26 mm. We set the cutting guide at 15 and removed the posterior 10 mm  of the patella, sized for a 41 button and drilled the lollipop. The knee  was then once again hyperflexed exposing the proximal tibia. We sized for a #5 tibial base plate, applied the smokestack and the conical reamer followed by the the Delta fin keel punch. We then hammered into place the Sigma RP trial femoral component, inserted a 10-mm trial bearing, trial patellar button, and took the knee through range of motion from 0-130 degrees. No thumb pressure was required for patellar  tracking. At this point, all trial components were removed, a double batch of DePuy HV cement with 1500 mg of Zinacef was mixed and applied to all bony metallic mating surfaces except for the posterior condyles of the femur itself. In order, we  hammered into place the tibial tray and removed excess cement, the femoral component and removed excess cement, a 10-mm Sigma RP bearing  was inserted, and the knee brought to full extension with compression.  The patellar button was clamped into place, and excess cement  removed. While the cement cured the wound was irrigated out with normal saline solution pulse lavage, and medium Hemovac drains were placed from an anterolateral  approach. Ligament stability and patellar tracking were checked and found to be excellent. The parapatellar arthrotomy was closed with  running #1 Vicryl suture. The subcutaneous tissue with 0 and 2-0 undyed  Vicryl suture, and the skin with skin staples. A dressing of Xeroform,  4 x 4, dressing sponges, Webril, and Ace wrap applied.  The patient  awakened, extubated, and taken to recovery room without difficulty.   Nestor Lewandowsky 02/26/2013, 10:43 AM    Gean Birchwood J 02/26/2013, 10:38 AM

## 2013-02-26 NOTE — Anesthesia Postprocedure Evaluation (Signed)
Anesthesia Post Note  Patient: Johnny Brooks  Procedure(s) Performed: Procedure(s) (LRB): TOTAL KNEE BILATERAL (Bilateral)  Anesthesia type: General  Patient location: PACU  Post pain: Pain level controlled and Adequate analgesia  Post assessment: Post-op Vital signs reviewed, Patient's Cardiovascular Status Stable, Respiratory Function Stable, Patent Airway and Pain level controlled  Last Vitals:  Filed Vitals:   02/26/13 1129  BP: 143/93  Pulse: 111  Temp: 36.9 C  Resp: 24    Post vital signs: Reviewed and stable  Level of consciousness: awake, alert  and oriented  Complications: No apparent anesthesia complications

## 2013-02-26 NOTE — Care Management Note (Signed)
CARE MANAGEMENT NOTE 02/26/2013  Patient:  Johnny Brooks, Johnny Brooks   Account Number:  [id number]  Date Initiated:  02/26/2013  Documentation initiated by:  Vance Peper  Subjective/Objective Assessment:   57 yr old male s/p bilateral knee replacvements.     Action/Plan:   CM spoke with patient and wife. Preoperatively setup with [facility], no changes. Will follow for PT eval.   Anticipated DC Date:     Anticipated DC Plan:           Choice offered to / List presented to:             [facility] agency  [facility]   Status of service:  In process, will continue to follow Medicare Important Message given?   (If response is "NO", the following Medicare IM given date fields will be blank) Date Medicare IM given:   Date Additional Medicare IM given:    Discharge Disposition:    Per UR Regulation:    If discussed at Long Length of Stay Meetings, dates discussed:    Comments:

## 2013-02-26 NOTE — Evaluation (Signed)
Physical Therapy Evaluation Patient Details Name: Johnny Brooks MRN: [id number] DOB: 1955/07/15 Today's Date: 02/26/2013 Time: 1191-4782 PT Time Calculation (min): 29 min  PT Assessment / Plan / Recommendation History of Present Illness  Patient is a 57 yo male s/p bil. TKA.  Clinical Impression  Patient presents with problems listed below.  Will benefit from acute PT to maximize independence prior to discharge.  Patient was independent pta.  Wife available 24 hours/day for supervision only - unable to provide physical assistance.  Recommend Inpatient Rehab consult for comprehensive therapies to maximize patient's functional independence and safety prior to discharge home with wife.    PT Assessment  Patient needs continued PT services    Follow Up Recommendations  CIR    Does the patient have the potential to tolerate intense rehabilitation      Barriers to Discharge Decreased caregiver support Wife can provide supervision only - no physical assist.    Equipment Recommendations  Hospital bed;Rolling walker with 5" wheels;Wheelchair (measurements PT);Wheelchair cushion (measurements PT);3in1 (PT)    Recommendations for Other Services Rehab consult   Frequency 7X/week    Precautions / Restrictions Precautions Precautions: Knee;Fall Precaution Booklet Issued: Yes (comment) Precaution Comments: Provided education to patient. Restrictions Weight Bearing Restrictions: Yes RLE Weight Bearing: Weight bearing as tolerated LLE Weight Bearing: Weight bearing as tolerated   Pertinent Vitals/Pain Pain 8/10, impacting mobility.      Mobility  Bed Mobility Bed Mobility: Supine to Sit;Sitting - Scoot to Edge of Bed;Sit to Supine Supine to Sit: 3: Mod assist;HOB elevated Sitting - Scoot to Edge of Bed: 4: Min assist;With rail Sit to Supine: 3: Mod assist;HOB elevated Details for Bed Mobility Assistance: Verbal cues for technique.  Assist to move bil. LE's off of bed.  Once sitting,  patient with good sitting balance.  Patient able to sit EOB x 10 min.  Pain increasing, so returned to supine with assist to bring LE's onto bed. Transfers Transfers: Not assessed (Patient with 2+/5 knee ext in sitting bil.  Unable to stand)    Exercises Total Joint Exercises Ankle Circles/Pumps: AROM;Both;10 reps;Supine;Seated   PT Diagnosis: Difficulty walking;Acute pain  PT Problem List: Decreased strength;Decreased range of motion;Decreased activity tolerance;Decreased balance;Decreased mobility;Decreased knowledge of use of DME;Decreased knowledge of precautions;Pain PT Treatment Interventions: DME instruction;Gait training;Functional mobility training;Therapeutic exercise;Patient/family education     PT Goals(Current goals can be found in the care plan section) Acute Rehab PT Goals Patient Stated Goal: To do what I need to do for the best recovery. PT Goal Formulation: With patient Time For Goal Achievement: 03/12/13 Potential to Achieve Goals: Good  Visit Information  Last PT Received On: 02/26/13 Assistance Needed: +2 History of Present Illness: Patient is a 57 yo male s/p bil. TKA.       Prior Functioning  Home Living Family/patient expects to be discharged to:: Inpatient rehab Living Arrangements: Spouse/significant other Available Help at Discharge: Family;Available 24 hours/day (Wife can provide supervision only) Type of Home: House Home Access: Level entry Home Layout: Multi-level;Able to live on main level with bedroom/bathroom (would put hospital bed on ground floor) Home Equipment: None Additional Comments: Patient willing to go to SNF for therapy at discharge if needed. Prior Function Level of Independence: Independent Communication Communication: No difficulties    Cognition  Cognition Arousal/Alertness: Awake/alert Behavior During Therapy: WFL for tasks assessed/performed Overall Cognitive Status: Within Functional Limits for tasks assessed     Extremity/Trunk Assessment Upper Extremity Assessment Upper Extremity Assessment: Overall WFL for tasks assessed  Lower Extremity Assessment Lower Extremity Assessment: RLE deficits/detail;LLE deficits/detail RLE Deficits / Details: Decreased strength and ROM due to surgery/pain. RLE: Unable to fully assess due to pain LLE Deficits / Details: Decreased strength and ROM due to surgery/pain. LLE: Unable to fully assess due to pain Cervical / Trunk Assessment Cervical / Trunk Assessment: Normal   Balance Balance Balance Assessed: Yes Static Sitting Balance Static Sitting - Balance Support: No upper extremity supported;Feet supported Static Sitting - Level of Assistance: 5: Stand by assistance Static Sitting - Comment/# of Minutes: 10 minutes  End of Session PT - End of Session Activity Tolerance: Patient limited by pain;Patient limited by fatigue Patient left: in bed;with call bell/phone within reach Nurse Communication: Mobility status CPM Left Knee CPM Left Knee: Off CPM Right Knee CPM Right Knee: Off  GP     Vena Austria 02/26/2013, 5:35 PM Durenda Hurt. Renaldo Fiddler, [facility] Acute Rehab Services Pager [phone]

## 2013-02-26 NOTE — Progress Notes (Signed)
Orthopedic Tech Progress Note Patient Details:  Johnny Brooks 06/22/1955 [id number]  Patient ID: [name], male   DOB: [birth year], 57 y.o.   MRN: [id number] placed pt's rle in cpm @ 0-60 degrees @1910   Nikki Dom 02/26/2013, 7:10 PM

## 2013-02-26 NOTE — Progress Notes (Signed)
Rehab Admissions Coordinator Note:  Patient was screened by Johnny Brooks for appropriateness for an Inpatient Acute Rehab Consult.  At this time, we are recommending Inpatient Rehab consult.  Johnny Brooks 02/26/2013, 7:16 PM  I can be reached at [phone].

## 2013-02-26 NOTE — Anesthesia Preprocedure Evaluation (Signed)
Anesthesia Evaluation  Patient identified by MRN, date of birth, ID band Patient awake    Reviewed: Allergy & Precautions, H&P , NPO status , Patient's Chart, lab work & pertinent test results  Airway Mallampati: II  Neck ROM: full    Dental   Pulmonary Current Smoker,          Cardiovascular negative cardio ROS      Neuro/Psych    GI/Hepatic   Endo/Other  obese  Renal/GU      Musculoskeletal   Abdominal   Peds  Hematology   Anesthesia Other Findings   Reproductive/Obstetrics                           Anesthesia Physical Anesthesia Plan  ASA: II  Anesthesia Plan: General   Post-op Pain Management:    Induction: Intravenous  Airway Management Planned: Oral ETT  Additional Equipment:   Intra-op Plan:   Post-operative Plan: Extubation in OR  Informed Consent: I have reviewed the patients History and Physical, chart, labs and discussed the procedure including the risks, benefits and alternatives for the proposed anesthesia with the patient or authorized representative who has indicated his/her understanding and acceptance.     Plan Discussed with: CRNA, Anesthesiologist and Surgeon  Anesthesia Plan Comments:         Anesthesia Quick Evaluation

## 2013-02-26 NOTE — Progress Notes (Addendum)
Orthopedic Tech Progress Note Patient Details:  BRADRICK KAMAU Oct 12, 1955 [id number] Patient has bilateral knee replacements. CPM placed on Right LE first and is to be rotated to left after allotted time. OHF applied to bed. Footsie roll provided.  CPM Right Knee CPM Right Knee: On Right Knee Flexion (Degrees): 60 Right Knee Extension (Degrees): 0   Asia R Thompson 02/26/2013, 1:12 PM Ortho tech received call from Orthopedics director to go ahead and apply CPM to left leg as well instead of rotating machine out from one leg to another. Spoke with patient and Architectural technologist about safety precautions and contraindications with apply this model of machine to both legs at once. Because bars are so wide patient's legs must be spread at an angle to accomodate both machines. At this time, patient is fine in both machines. When patient is to increase flexion to higher ROM issues may present. Patient informed to let nurse know if there is any discomfort whatsoever with CPM machines.

## 2013-02-26 NOTE — Anesthesia Procedure Notes (Signed)
Procedure Name: Intubation Date/Time: 02/26/2013 7:42 AM Performed by: Sherie Don Pre-anesthesia Checklist: Patient identified, Emergency Drugs available, Suction available, Patient being monitored and Timeout performed Patient Re-evaluated:Patient Re-evaluated prior to inductionOxygen Delivery Method: Circle system utilized Preoxygenation: Pre-oxygenation with 100% oxygen Intubation Type: IV induction Ventilation: Mask ventilation without difficulty and Oral airway inserted - appropriate to patient size Laryngoscope Size: Mac and 4 Grade View: Grade III Tube type: Oral Tube size: 7.5 mm Number of attempts: 1 Airway Equipment and Method: Stylet and LTA kit utilized Placement Confirmation: ETT inserted through vocal cords under direct vision,  positive ETCO2 and breath sounds checked- equal and bilateral Secured at: 22 cm Tube secured with: Tape Dental Injury: Teeth and Oropharynx as per pre-operative assessment

## 2013-02-26 NOTE — Preoperative (Signed)
Beta Blockers   Reason not to administer Beta Blockers:Not Applicable 

## 2013-02-26 NOTE — Transfer of Care (Signed)
Immediate Anesthesia Transfer of Care Note  Patient: Johnny Brooks  Procedure(s) Performed: Procedure(s): TOTAL KNEE BILATERAL (Bilateral)  Patient Location: PACU  Anesthesia Type:General  Level of Consciousness: sedated and patient cooperative  Airway & Oxygen Therapy: Patient Spontanous Breathing and Patient connected to face mask oxygen  Post-op Assessment: Report given to PACU RN and Post -op Vital signs reviewed and stable  Post vital signs: Reviewed and stable  Complications: No apparent anesthesia complications

## 2013-02-27 DIAGNOSIS — M1710 Unilateral primary osteoarthritis, unspecified knee: Secondary | ICD-10-CM

## 2013-02-27 DIAGNOSIS — Z96659 Presence of unspecified artificial knee joint: Secondary | ICD-10-CM

## 2013-02-27 LAB — CBC
MCH: 34 pg (ref 26.0–34.0)
MCHC: 34.7 g/dL (ref 30.0–36.0)
Platelets: 214 10*3/uL (ref 150–400)
RDW: 13.9 % (ref 11.5–15.5)

## 2013-02-27 NOTE — Progress Notes (Signed)
Physical Therapy Treatment Patient Details Name: Johnny Brooks MRN: [id number] DOB: 1956/02/05 Today's Date: 02/27/2013 Time: 1478-2956 PT Time Calculation (min): 45 min  PT Assessment / Plan / Recommendation  History of Present Illness Patient is a 57 yo male s/p bil. TKA.   PT Comments   Making progress with mobility, activity tolerance, and ability to bear weight on bil LEs; Single limb stance on bil LEs relatively stable, with no buckling noted during upright activity  Continue to recommend comprehensive inpatient rehab (CIR) for post-acute therapy needs.   Follow Up Recommendations  CIR     Does the patient have the potential to tolerate intense rehabilitation     Barriers to Discharge        Equipment Recommendations  Hospital bed;Rolling walker with 5" wheels;Wheelchair (measurements PT);Wheelchair cushion (measurements PT);3in1 (PT)    Recommendations for Other Services Rehab consult  Frequency 7X/week   Progress towards PT Goals Progress towards PT goals: Progressing toward goals  Plan Current plan remains appropriate    Precautions / Restrictions Precautions Precautions: Knee;Fall Precaution Booklet Issued: Yes (comment) Precaution Comments: Provided education to patient. Restrictions Weight Bearing Restrictions: Yes RLE Weight Bearing: Weight bearing as tolerated LLE Weight Bearing: Weight bearing as tolerated   Pertinent Vitals/Pain 8/10 bil knees, approaching 10/10 towards end of session patient repositioned for comfort and optimal knee extension RN provided medication to assist with pain control     Mobility  Bed Mobility Bed Mobility: Supine to Sit;Sitting - Scoot to Edge of Bed Supine to Sit: 3: Mod assist;With rails;HOB elevated Sitting - Scoot to Edge of Bed: 4: Min assist;With rail Details for Bed Mobility Assistance: Verbal cues for technique.  Assist to move bil. LE's off of bed.  Once sitting, patient with good sitting balance.    Transfers Transfers: Sit to Stand;Stand to Sit Sit to Stand: 3: Mod assist;From bed;From elevated surface (second person for safety) Stand to Sit: 3: Mod assist;To chair/3-in-1;With armrests;To elevated surface Details for Transfer Assistance: Verbal, tactile, and demo cues for technique; stood from elevated bed with one hand on foot board of bed and the other holding RW (required physical assist to steady RW); quite dependent on momentum Ambulation/Gait Ambulation/Gait Assistance: 1: +2 Total assist (for safety) Ambulation/Gait: Patient Percentage: 70% Ambulation Distance (Feet): 5 Feet Assistive device: Rolling walker Ambulation/Gait Assistance Details: Cues for gait sequence and posture; heavily dependent on RW support; no buckling noted Gait Pattern: Decreased step length - right;Decreased step length - left    Exercises Total Joint Exercises Quad Sets: AROM;Both;10 reps;Supine Heel Slides:  (deferred due to pain) Straight Leg Raises: AAROM;Both;10 reps;Supine (Alternating)   PT Diagnosis:    PT Problem List:   PT Treatment Interventions:     PT Goals (current goals can now be found in the care plan section) Acute Rehab PT Goals Patient Stated Goal: To do what I need to do for the best recovery. PT Goal Formulation: With patient Time For Goal Achievement: 03/12/13 Potential to Achieve Goals: Good  Visit Information  Last PT Received On: 02/27/13 Assistance Needed: +2 History of Present Illness: Patient is a 57 yo male s/p bil. TKA.    Subjective Data  Subjective: In a lot of pain Patient Stated Goal: To do what I need to do for the best recovery.   Cognition  Cognition Arousal/Alertness: Awake/alert Behavior During Therapy: WFL for tasks assessed/performed Overall Cognitive Status: Within Functional Limits for tasks assessed    Balance  Balance Balance Assessed: Yes Static Sitting  Balance Static Sitting - Balance Support: No upper extremity supported;Feet  supported Static Sitting - Level of Assistance: 5: Stand by assistance Static Sitting - Comment/# of Minutes: 5  End of Session PT - End of Session Equipment Utilized During Treatment: Gait belt Activity Tolerance: Patient limited by pain;Patient limited by fatigue Patient left: in chair;with call bell/phone within reach;with nursing/sitter in room Nurse Communication: Mobility status (and to coordinate next session with pain meds)   GP     Van Clines [facility] [place], [place] [phone]  02/27/2013, 12:30 PM

## 2013-02-27 NOTE — Consult Note (Signed)
Physical Medicine and Rehabilitation Consult Reason for Consult: Bilateral total knee replacement Referring Physician: Dr. Turner Daniels   HPI: Johnny Brooks is a 57 y.o. right handed male admitted 02/26/2013 with bilateral endstage changes of the knees failed nonsurgical conservative treatments. Independent prior to admission working full-time. Underwent bilateral total knee replacement 02/26/2013 per Dr. Turner Daniels. Postoperative pain management. Weightbearing as tolerated bilateral lower extremities. Placed on aspirin 325 mg twice a day x2 weeks for DVT prophylaxis. Acute blood loss anemia 10.3 and monitored. Physical therapy evaluation completed 02/26/2013 with recommendations for physical medicine rehabilitation consult to consider inpatient rehabilitation services Review of Systems  Musculoskeletal: Positive for joint pain and myalgias.  All other systems reviewed and are negative.   Past Medical History  Diagnosis Date  . Arthritis     knees  . Left bundle branch block    Past Surgical History  Procedure Laterality Date  . Knee surgery      left  1973  . Arthroscopies      numerous on knees  . Dislocated clavicle      right  . Ankle surgery      right   History reviewed. No pertinent family history. Social History:  reports that he has been smoking Cigars.  He does not have any smokeless tobacco history on file. He reports that he drinks about 1.8 ounces of alcohol per week. He reports that he does not use illicit drugs. Allergies: No Known Allergies Medications Prior to Admission  Medication Sig Dispense Refill  . Coenzyme Q10 (CO Q-10 PO) Take 1 tablet by mouth daily.      . fluticasone (FLONASE) 50 MCG/ACT nasal spray Place 1 spray into both nostrils daily as needed for allergies or rhinitis.      Marland Kitchen loratadine (CLARITIN) 10 MG tablet Take 10 mg by mouth daily as needed for allergies.      Marland Kitchen OVER THE COUNTER MEDICATION Take 2 capsules by mouth daily. Testosterone supplement       . Specialty Vitamins Products (PROSTATE PO) Take 1 tablet by mouth daily.      . [DISCONTINUED] ibuprofen (ADVIL,MOTRIN) 200 MG tablet Take 400 mg by mouth every 6 (six) hours as needed for fever, headache or moderate pain.      . TURMERIC PO Take by mouth.      . [DISCONTINUED] aspirin EC 325 MG tablet Take 650 mg by mouth every 4 (four) hours as needed for mild pain.        Home: Home Living Family/patient expects to be discharged to:: Inpatient rehab Living Arrangements: Spouse/significant other Available Help at Discharge: Family;Available 24 hours/day (Wife can provide supervision only) Type of Home: House Home Access: Level entry Home Layout: Multi-level;Able to live on main level with bedroom/bathroom (would put hospital bed on ground floor) Home Equipment: None Additional Comments: Patient willing to go to SNF for therapy at discharge if needed.  Functional History:   Functional Status:  Mobility: Bed Mobility Bed Mobility: Supine to Sit;Sitting - Scoot to Edge of Bed;Sit to Supine Supine to Sit: 3: Mod assist;HOB elevated Sitting - Scoot to Edge of Bed: 4: Min assist;With rail Sit to Supine: 3: Mod assist;HOB elevated Transfers Transfers: Not assessed (Patient with 2+/5 knee ext in sitting bil.  Unable to stand)      ADL:    Cognition: Cognition Overall Cognitive Status: Within Functional Limits for tasks assessed Orientation Level: Oriented X4 Cognition Arousal/Alertness: Awake/alert Behavior During Therapy: WFL for tasks assessed/performed Overall Cognitive Status: Within  Functional Limits for tasks assessed  Blood pressure 135/90, pulse 6, temperature 97.8 F (36.6 C), temperature source Oral, resp. rate 18, SpO2 99.00%. Physical Exam  Vitals reviewed. Constitutional: He is oriented to person, place, and time. He appears well-developed.  HENT:  Head: Normocephalic.  Eyes: EOM are normal.  Neck: Normal range of motion. Neck supple. No thyromegaly  present.  Cardiovascular: Normal rate and regular rhythm.   Respiratory: Effort normal and breath sounds normal. No respiratory distress.  GI: Soft. Bowel sounds are normal. He exhibits no distension.  Musculoskeletal:  Swelling around incision sites  Neurological: He is alert and oriented to person, place, and time.  Both legs neurovascularly intact. Full ankle movement and strength. UE's 5/5  Skin:  Bilateral knees with surgical dressing. Appropriately tender  Psychiatric: He has a normal mood and affect. His behavior is normal. Judgment and thought content normal.    Results for orders placed during the hospital encounter of 02/26/13 (from the past 24 hour(s))  CBC     Status: Abnormal   Collection Time    02/27/13  5:05 AM      Result Value Range   WBC 8.6  4.0 - 10.5 K/uL   RBC 3.03 (*) 4.22 - 5.81 MIL/uL   Hemoglobin 10.3 (*) 13.0 - 17.0 g/dL   HCT 86.5 (*) 78.4 - 69.6 %   MCV 98.0  78.0 - 100.0 fL   MCH 34.0  26.0 - 34.0 pg   MCHC 34.7  30.0 - 36.0 g/dL   RDW 29.5  28.4 - 13.2 %   Platelets 214  150 - 400 K/uL   No results found.  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 Does the need for close, 24 hr/day medical supervision in concert with the patient's rehab needs make it unreasonable for this patient to be served in a less intensive setting? Yes 2. Co-Morbidities requiring supervision/potential complications: pain, ABLA, post-op sequelae 3. Due to bladder management, bowel management, safety, skin/wound care, disease management, medication administration, pain management and patient education, does the patient require 24 hr/day rehab nursing? Yes 4. Does the patient require coordinated care of a physician, rehab nurse, PT (1-2 hrs/day, 5 days/week) and OT (1-2 hrs/day, 5 days/week) to address physical and functional deficits in the context of the above medical diagnosis(es)? Yes Addressing deficits in the following areas: balance,  endurance, locomotion, strength, transferring, bowel/bladder control, bathing, dressing, feeding, grooming, toileting and psychosocial support 5. Can the patient actively participate in an intensive therapy program of at least 3 hrs of therapy per day at least 5 days per week? Yes 6. The potential for patient to make measurable gains while on inpatient rehab is excellent 7. Anticipated functional outcomes upon discharge from inpatient rehab are mod I with PT, mod I with OT, n/a with SLP. 8. Estimated rehab length of stay to reach the above functional goals is: 7-10 days 9. Does the patient have adequate social supports to accommodate these discharge functional goals? Yes 10. Anticipated D/C setting: Home 11. Anticipated post D/C treatments: HH therapy 12. Overall Rehab/Functional Prognosis: excellent  RECOMMENDATIONS: This patient's condition is appropriate for continued rehabilitative care in the following setting: CIR Patient has agreed to participate in recommended program. Yes Note that insurance prior authorization may be required for reimbursement for recommended care.  Comment: Rehab RN to follow up. Pt has a split level home which makes things quite difficult for him. CIR is the ideal venue for his rehab  Ranelle Oyster, MD, [place]     02/27/2013

## 2013-02-27 NOTE — Care Management Note (Signed)
Case Manager spoke with patient. Plan is for  [facility] IP [facility]. Preoperatively setup with [facility], they will follow patient. [employer] will provide 3in1, walker and CPM at discharge.

## 2013-02-27 NOTE — Evaluation (Signed)
Occupational Therapy Evaluation Patient Details Name: Johnny Brooks MRN: [id number] DOB: 1955/11/22 Today's Date: 02/27/2013 Time: 1478-2956 OT Time Calculation (min): 28 min  OT Assessment / Plan / Recommendation History of present illness Patient is a 57 yo male s/p bil. TKA.   Clinical Impression   Pt admitted with above diagnosis and has the deficits listed below. Pt would benefit from cont OT to maximize I with adls and functional mobility so he can transfer home safely with his wife who is unable to physically assist him.    OT Assessment  Patient needs continued OT Services    Follow Up Recommendations  CIR;Supervision/Assistance - 24 hour    Barriers to Discharge Decreased caregiver support wife cannot physically assist pt.  Equipment Recommendations  Other (comment) (TBD.  Possibly a shower chair.)    Recommendations for Other Services Rehab consult  Frequency  Min 3X/week    Precautions / Restrictions Precautions Precautions: Knee;Fall Precaution Booklet Issued: Yes (comment) Precaution Comments: Provided education to patient. Restrictions Weight Bearing Restrictions: Yes RLE Weight Bearing: Weight bearing as tolerated LLE Weight Bearing: Weight bearing as tolerated   Pertinent Vitals/Pain Pt in 10/10 pain. Nursing aware.  Meds given by nurse.  Legs elevated and position changed.    ADL  Eating/Feeding: Performed;Independent Where Assessed - Eating/Feeding: Chair Grooming: Performed;Set up Where Assessed - Grooming: Unsupported sitting Upper Body Bathing: Simulated;Set up Where Assessed - Upper Body Bathing: Unsupported sitting Lower Body Bathing: Performed;Maximal assistance Where Assessed - Lower Body Bathing: Supported sit to stand Upper Body Dressing: Simulated;Set up Where Assessed - Upper Body Dressing: Unsupported sitting Lower Body Dressing: Performed;Maximal assistance Where Assessed - Lower Body Dressing: Supported sit to stand Toilet Transfer:  Performed;+2 Total assistance Toilet Transfer: Patient Percentage: 60% Statistician Method: Surveyor, minerals: Materials engineer and Hygiene: Simulated;Maximal assistance Where Assessed - Engineer, mining and Hygiene: Standing Equipment Used: Rolling walker Transfers/Ambulation Related to ADLs: Pt pushes up from walker at this time to stand but was able to do  this with only +1 mod assist today.  Pt needs +2 to transfer for safety reasons.  Pt unable to push up from surface he is leaaving yet to stand.   ADL Comments: Pt requires a great amount of assist with LE adls. Pt introduced to reacher/sock aid/sponge/shoe horn and wife will purchase in gift store.  Pt unable, yet, to let go of walker with one hand to do adls in standing.     OT Diagnosis: Generalized weakness;Acute pain  OT Problem List: Decreased activity tolerance;Impaired balance (sitting and/or standing);Decreased knowledge of use of DME or AE;Pain OT Treatment Interventions: Self-care/ADL training;DME and/or AE instruction;Therapeutic activities   OT Goals(Current goals can be found in the care plan section) Acute Rehab OT Goals Patient Stated Goal: To do what I need to do for the best recovery. OT Goal Formulation: With patient Time For Goal Achievement: 03/13/13 Potential to Achieve Goals: Good ADL Goals Pt Will Perform Grooming: with min guard assist;standing Pt Will Perform Lower Body Bathing: with mod assist;with adaptive equipment;sit to/from stand Pt Will Perform Lower Body Dressing: with mod assist;with adaptive equipment;sit to/from stand Pt Will Perform Tub/Shower Transfer: Shower transfer;with min assist;rolling walker;ambulating Additional ADL Goal #1: Pt will complete all toileting on comfort commode with S and walker.  Visit Information  Last OT Received On: 02/27/13 Assistance Needed: +2 History of Present Illness: Patient is a 57 yo male  s/p bil. TKA.  Prior Functioning     Home Living Family/patient expects to be discharged to:: Inpatient rehab Living Arrangements: Spouse/significant other Available Help at Discharge: Family;Available 24 hours/day Type of Home: House Home Access: Level entry Home Layout: Multi-level;Able to live on main level with bedroom/bathroom Home Equipment: None Additional Comments: Patient willing to go to SNF for therapy at discharge if needed. Prior Function Level of Independence: Independent Comments: pt with walk in shower and high commodes throughout Communication Communication: No difficulties Dominant Hand: Right         Vision/Perception Vision - History Baseline Vision: No visual deficits Patient Visual Report: No change from baseline Vision - Assessment Vision Assessment: Vision not tested   Cognition  Cognition Arousal/Alertness: Awake/alert Behavior During Therapy: WFL for tasks assessed/performed Overall Cognitive Status: Within Functional Limits for tasks assessed    Extremity/Trunk Assessment Upper Extremity Assessment Upper Extremity Assessment: Overall WFL for tasks assessed Lower Extremity Assessment Lower Extremity Assessment: Defer to PT evaluation Cervical / Trunk Assessment Cervical / Trunk Assessment: Normal     Mobility Bed Mobility Bed Mobility: Not assessed Transfers Transfers: Sit to Stand;Stand to Sit Sit to Stand: 3: Mod assist;From chair/3-in-1;Other (comment) (pusing up on walker.) Stand to Sit: 2: Max assist;To chair/3-in-1 Details for Transfer Assistance: Pt requires assist to stand and is not able to push up from surface he is leaving yet.  Pt has hard time controlling decent into sitting.     Exercise     Balance Balance Balance Assessed: Yes Static Sitting Balance Static Sitting - Balance Support: No upper extremity supported;Feet supported Static Sitting - Level of Assistance: 5: Stand by assistance Static Sitting -  Comment/# of Minutes: 5   End of Session CPM Left Knee CPM Left Knee: On Left Knee Flexion (Degrees): 60 Left Knee Extension (Degrees): 0  GO     Johnny Brooks, Johnny Brooks 02/27/2013, 11:01 AM [phone]

## 2013-02-27 NOTE — Progress Notes (Signed)
Patient ID: Johnny Brooks, male   DOB: 04-06-1955, 57 y.o.   MRN: [id number] PATIENT ID: Johnny Brooks  MRN: [id number]  DOB/AGE:  March 08, 1956 / 57 y.o.  1 Day Post-Op Procedure(s) (LRB): TOTAL KNEE BILATERAL (Bilateral)    PROGRESS NOTE Subjective: Patient is alert, oriented, no Nausea, no Vomiting, yes passing gas, no Bowel Movement. Taking PO well. Denies SOB, Chest or Calf Pain. Using Incentive Spirometer, PAS in place. Ambulate WBAT, CPM 0-60 Patient reports pain as 3 on 0-10 scale and 7 on 0-10 scale  .    Objective: Vital signs in last 24 hours: Filed Vitals:   02/26/13 1345 02/26/13 1600 02/26/13 2113 02/27/13 0625  BP: 151/82  134/88 135/90  Pulse: 94  111 6  Temp: 98.8 F (37.1 C)  98.7 F (37.1 C) 97.8 F (36.6 C)  TempSrc:      Resp: 20 18 18 18   SpO2: 100%  95% 99%      Intake/Output from previous day: I/O last 3 completed shifts: In: 5740 [P.O.:840; I.V.:4900] Out: 5225 [Urine:4150; Drains:1075]   Intake/Output this shift:     LABORATORY DATA:  Recent Labs  02/27/13 0505  WBC 8.6  HGB 10.3*  HCT 29.7*  PLT 214    Examination: Neurologically intact ABD soft Neurovascular intact Sensation intact distally Intact pulses distally Dorsiflexion/Plantar flexion intact Incision: no drainage No cellulitis present Compartment soft}  Assessment:   1 Day Post-Op Procedure(s) (LRB): TOTAL KNEE BILATERAL (Bilateral) ADDITIONAL DIAGNOSIS:    Plan: PT/OT WBAT, CPM 5/hrs day until ROM 0-90 degrees, then D/C CPM DVT Prophylaxis:  SCDx72hrs, ASA 325 mg BID x 2 weeks DISCHARGE PLAN: Inpatient Rehab  DISCHARGE NEEDS: HHPT, HHRN, CPM, Walker and 3-in-1 comode seat     Colum Colt J 02/27/2013, 7:31 AM

## 2013-02-27 NOTE — Progress Notes (Signed)
Physical Therapy Treatment Note  Making progress with mobility and activity tolerance Continue to recommend comprehensive inpatient rehab (CIR) for post-acute therapy needs.  Pain bil knees approaching 10/10; patient repositioned for comfort in CPMs   02/27/13 1600  PT Visit Information  Last PT Received On 02/27/13  Assistance Needed +2  History of Present Illness Patient is a 57 yo male s/p bil. TKA.  PT Time Calculation  PT Start Time 1408  PT Stop Time 1438  PT Time Calculation (min) 30 min  Subjective Data  Subjective In a lot of pain  Patient Stated Goal To do what I need to do for the best recovery.  Precautions  Precautions Knee;Fall  Precaution Comments Provided education to patient.  Restrictions  RLE Weight Bearing WBAT  LLE Weight Bearing WBAT  Cognition  Arousal/Alertness Awake/alert  Behavior During Therapy WFL for tasks assessed/performed  Overall Cognitive Status Within Functional Limits for tasks assessed  Bed Mobility  Bed Mobility Sit to Supine  Sit to Supine 3: Mod assist;HOB elevated  Details for Bed Mobility Assistance Verbal cues for technique.  Assist to move bil. LE's onto bed.  Transfers  Transfers Sit to Stand;Stand to Sit  Sit to Stand 3: Mod assist;From bed;From elevated surface (second person for safety)  Stand to Sit 3: Mod assist;To chair/3-in-1;With armrests;To elevated surface  Details for Transfer Assistance Verbal, tactile, and demo cues for technique; Dependent on momentum, but more control than earlier today  Ambulation/Gait  Ambulation/Gait Assistance 1: +2 Total assist (for safety)  Ambulation/Gait: Patient Percentage 70%  Ambulation Distance (Feet) 8 Feet  Assistive device Rolling walker  Ambulation/Gait Assistance Details Heavily dependent on UE support, but once standing, he was willing to take more steps than just to the bed; performed steps about 4 feet forwards and backwards  Gait Pattern Decreased step length -  right;Decreased step length - left  Static Sitting Balance  Static Sitting - Balance Support No upper extremity supported;Feet supported  Static Sitting - Level of Assistance 5: Stand by assistance  PT - End of Session  Equipment Utilized During Treatment Gait belt  Activity Tolerance Patient limited by pain;Patient limited by fatigue  Patient left with call bell/phone within reach;with nursing/sitter in room;in bed;in CPM  Nurse Communication Mobility status (and to coordinate next session with pain meds)  PT - Assessment/Plan  PT Plan Current plan remains appropriate  PT Frequency 7X/week  Recommendations for Other Services Rehab consult  Follow Up Recommendations CIR  PT equipment Hospital bed;Rolling walker with 5" wheels;Wheelchair (measurements PT);Wheelchair cushion (measurements PT);3in1 (PT)  PT Goal Progression  Progress towards PT goals Progressing toward goals  Acute Rehab PT Goals  PT Goal Formulation With patient  Time For Goal Achievement 03/12/13  Potential to Achieve Goals Good  PT General Charges  $$ ACUTE PT VISIT 1 Procedure  PT Treatments  $Gait Training 8-22 mins  $Therapeutic Activity 8-22 mins   [place], [place] [phone]

## 2013-02-28 ENCOUNTER — Encounter (HOSPITAL_COMMUNITY): Payer: Self-pay | Admitting: Orthopedic Surgery

## 2013-02-28 LAB — CBC
HCT: 28.1 % — ABNORMAL LOW (ref 39.0–52.0)
Hemoglobin: 9.6 g/dL — ABNORMAL LOW (ref 13.0–17.0)
MCH: 33.9 pg (ref 26.0–34.0)
MCHC: 34.2 g/dL (ref 30.0–36.0)
MCV: 99.3 fL (ref 78.0–100.0)
RBC: 2.83 MIL/uL — ABNORMAL LOW (ref 4.22–5.81)

## 2013-02-28 NOTE — Evaluation (Signed)
Physical Therapy Evaluation Patient Details Name: Johnny Brooks MRN: [id number] DOB: 01-Dec-1955 Today's Date: 02/28/2013 Time: 4098-1191 PT Time Calculation (min): 52 min  PT Assessment / Plan / Recommendation History of Present Illness  Patient is a 57 yo male s/p bil. TKA.  Clinical Impression  Noting progress with bed mobility and transfers; Amb today limited by dizziness and nausea -- suspect die to medications  Continue to recommend comprehensive inpatient rehab (CIR) for post-acute therapy needs.     PT Assessment       Follow Up Recommendations  CIR    Does the patient have the potential to tolerate intense rehabilitation      Barriers to Discharge        Equipment Recommendations  Hospital bed;Rolling walker with 5" wheels;Wheelchair (measurements PT);Wheelchair cushion (measurements PT);3in1 (PT)    Recommendations for Other Services Rehab consult   Frequency 7X/week    Precautions / Restrictions Precautions Precautions: Knee;Fall Precaution Comments: Provided education to patient. Restrictions Weight Bearing Restrictions: Yes RLE Weight Bearing: Weight bearing as tolerated LLE Weight Bearing: Weight bearing as tolerated   Pertinent Vitals/Pain 7/10 bil knees; patient repositioned for comfort VSS      Mobility  Bed Mobility Bed Mobility: Supine to Sit;Sitting - Scoot to Edge of Bed Supine to Sit: 3: Mod assist;With rails;HOB elevated (light mod assist) Sitting - Scoot to Edge of Bed: 4: Min assist;With rail Details for Bed Mobility Assistance: Verbal cues for technique. Requiring less assist  to move bil. LE's off of bed.  Once sitting, patient with good sitting balance.   Transfers Transfers: Sit to Stand;Stand to Sit Sit to Stand: 3: Mod assist;From bed;From elevated surface (second person for safety) Stand to Sit: 3: Mod assist;To chair/3-in-1;With armrests;To elevated surface Details for Transfer Assistance: Verbal, tactile, and demo cues for  technique; stood from elevated bed with one hand on foot board of bed and the other holding RW (required physical assist to steady RW); quite dependent on momentum; bil knees slightly flexed at initial stand; cues to activate bil quads for stance stability Ambulation/Gait Ambulation/Gait Assistance: 1: +2 Total assist (for safety) Ambulation/Gait: Patient Percentage: 70% Ambulation Distance (Feet): 4 Feet Assistive device: Rolling walker Ambulation/Gait Assistance Details: Amb distance today limited by lightheadedness and nausea Gait Pattern: Decreased step length - right;Decreased step length - left    Exercises Total Joint Exercises Ankle Circles/Pumps: AROM;Both;20 reps Quad Sets: AROM;Both;10 reps;Supine Short Arc Quad: AAROM;Both;10 reps;Supine (alternating) Heel Slides: AAROM;Both;Other reps (comment) (2; limited by pain) Straight Leg Raises: AAROM;Both;10 reps;Supine (Alternating)   PT Diagnosis:    PT Problem List:   PT Treatment Interventions:       PT Goals(Current goals can be found in the care plan section) Acute Rehab PT Goals Patient Stated Goal: To do what I need to do for the best recovery. PT Goal Formulation: With patient Time For Goal Achievement: 03/12/13 Potential to Achieve Goals: Good  Visit Information  Last PT Received On: 02/28/13 Assistance Needed: +2 History of Present Illness: Patient is a 57 yo male s/p bil. TKA.       Prior Functioning       Cognition  Cognition Arousal/Alertness: Awake/alert Behavior During Therapy: WFL for tasks assessed/performed Overall Cognitive Status: Within Functional Limits for tasks assessed    Extremity/Trunk Assessment     Balance    End of Session PT - End of Session Equipment Utilized During Treatment: Gait belt Activity Tolerance: Patient limited by pain;Patient limited by fatigue Patient left: in chair;with  call bell/phone within reach;with nursing/sitter in room Nurse Communication: Mobility status (and  to coordinate next session with pain meds) CPM Left Knee CPM Left Knee: Off CPM Right Knee CPM Right Knee: Off  GP     Van Clines [place], [place] [phone]  02/28/2013, 12:18 PM

## 2013-02-28 NOTE — Progress Notes (Signed)
PATIENT ID: Johnny Brooks  MRN: [id number]  DOB/AGE:  [birth year] / 57 y.o.  2 Days Post-Op Procedure(s) (LRB): TOTAL KNEE BILATERAL (Bilateral)    PROGRESS NOTE Subjective: Patient is alert, oriented, no Nausea, no Vomiting, yes passing gas, no Bowel Movement. Taking PO well, working on foods that don't have as much grease. Denies SOB, Chest or Calf Pain. Using Incentive Spirometer, PAS in place. Ambulate WBAT, CPM 0-60 Patient reports pain as severe  .    Objective: Vital signs in last 24 hours: Filed Vitals:   02/27/13 1349 02/27/13 1530 02/27/13 2126 02/28/13 0632  BP: 139/49  111/66 112/62  Pulse: 114  116 106  Temp: 99.3 F (37.4 C)  97.8 F (36.6 C) 97.6 F (36.4 C)  TempSrc:      Resp: 18 18 18 18   Height:      Weight:      SpO2: 94% 96% 97% 98%      Intake/Output from previous day: I/O last 3 completed shifts: In: 2460 [P.O.:960; I.V.:1500] Out: 3250 [Urine:2750; Drains:500]   Intake/Output this shift:     LABORATORY DATA:  Recent Labs  02/27/13 0505 02/28/13 0412  WBC 8.6 10.6*  HGB 10.3* 9.6*  HCT 29.7* 28.1*  PLT 214 173    Examination: Neurologically intact Neurovascular intact Sensation intact distally Intact pulses distally Dorsiflexion/Plantar flexion intact Incision: dressing C/D/I and scant drainage No cellulitis present Compartment soft}  Assessment:   2 Days Post-Op Procedure(s) (LRB): TOTAL KNEE BILATERAL (Bilateral) ADDITIONAL DIAGNOSIS:    Plan: PT/OT WBAT, CPM 5/hrs day until ROM 0-90 degrees, then D/C CPM DVT Prophylaxis:  SCDx72hrs, ASA 325 mg BID x 2 weeks DISCHARGE PLAN: Inpatient Rehab when cleared by insurance. DISCHARGE NEEDS: HHPT, HHRN, CPM, Walker and 3-in-1 comode seat     Alexis Mizuno R 02/28/2013, 7:51 AM

## 2013-02-28 NOTE — Progress Notes (Signed)
Physical Therapy Treatment Patient Details Name: Johnny Brooks MRN: [id number] DOB: 1955-08-25 Today's Date: 02/28/2013 Time: 4098-1191 PT Time Calculation (min): 28 min  PT Assessment / Plan / Recommendation  History of Present Illness Patient is a 57 yo male s/p bil. TKA.   PT Comments   Much improved standing tolerance, with greater time spent standing and more amb distance  Follow Up Recommendations  CIR     Does the patient have the potential to tolerate intense rehabilitation     Barriers to Discharge        Equipment Recommendations  Hospital bed;Rolling walker with 5" wheels;Wheelchair (measurements PT);Wheelchair cushion (measurements PT);3in1 (PT)    Recommendations for Other Services Rehab consult  Frequency 7X/week   Progress towards PT Goals Progress towards PT goals: Progressing toward goals  Plan Current plan remains appropriate    Precautions / Restrictions Precautions Precautions: Knee;Fall Precaution Booklet Issued: No Precaution Comments: Provided education to patient. Restrictions Weight Bearing Restrictions: Yes RLE Weight Bearing: Weight bearing as tolerated LLE Weight Bearing: Weight bearing as tolerated   Pertinent Vitals/Pain 7/10 pain bilaterally; patient repositioned for comfort with RLE in CPM    Mobility  Bed Mobility Bed Mobility: Supine to Sit;Sitting - Scoot to Edge of Bed;Sit to Supine Supine to Sit: With rails;HOB elevated;4: Min assist Sitting - Scoot to Edge of Bed: 4: Min assist;With rail Sit to Supine: 3: Mod assist;HOB elevated Details for Bed Mobility Assistance: Mod assist for LEs into bed; slow moving, but needing less assist Transfers Transfers: Sit to Stand;Stand to Sit Sit to Stand: From bed;From elevated surface;4: Min assist (second person for safety) Stand to Sit: To chair/3-in-1;With armrests;To elevated surface;4: Min assist Details for Transfer Assistance: Cues for technique. Pt with much improvement for sit to  stand from bed.  2nd person for safety with transfers and ambulation. Ambulation/Gait Ambulation/Gait Assistance: 1: +2 Total assist (for safety) Ambulation/Gait: Patient Percentage: 80% Ambulation Distance (Feet): 22 Feet Assistive device: Rolling walker Ambulation/Gait Assistance Details: Cues for posture and rW proximity; No buckling noted; quite stiff in stance; not limited by lightheadedness this afternoon Gait Pattern: Decreased step length - right;Decreased step length - left    Exercises     PT Diagnosis:    PT Problem List:   PT Treatment Interventions:     PT Goals (current goals can now be found in the care plan section) Acute Rehab PT Goals Patient Stated Goal: To do what I need to do for the best recovery. PT Goal Formulation: With patient Time For Goal Achievement: 03/12/13 Potential to Achieve Goals: Good  Visit Information  Last PT Received On: 02/28/13 Assistance Needed: +1 History of Present Illness: Patient is a 57 yo male s/p bil. TKA.    Subjective Data  Subjective: In a lot of pain Patient Stated Goal: To do what I need to do for the best recovery.   Cognition  Cognition Arousal/Alertness: Awake/alert Behavior During Therapy: WFL for tasks assessed/performed Overall Cognitive Status: Within Functional Limits for tasks assessed    Balance     End of Session PT - End of Session Equipment Utilized During Treatment: Gait belt Activity Tolerance: Patient limited by pain;Patient limited by fatigue Patient left: in chair;with call bell/phone within reach;with nursing/sitter in room Nurse Communication: Mobility status (and to coordinate next session with pain meds) CPM Right Knee CPM Right Knee: On Right Knee Flexion (Degrees): 48 Right Knee Extension (Degrees): 0   GP     Marylu Lund, [facility] [place],  PT [phone]  02/28/2013, 4:35 PM

## 2013-02-28 NOTE — Progress Notes (Addendum)
Occupational Therapy Treatment Patient Details Name: Johnny Brooks MRN: [id number] DOB: [birth year] Today's Date: 02/28/2013 Time: 4098-1191 OT Time Calculation (min): 32 min  OT Assessment / Plan / Recommendation  History of present illness Patient is a 57 yo male s/p bil. TKA.   OT comments  Pt progressing towards goals. Continue to recommend CIR for additional rehab prior to d/c home.   Follow Up Recommendations  CIR;Supervision/Assistance - 24 hour    Barriers to Discharge       Equipment Recommendations  3 in 1 bedside comode;Other (comment) (shower equipment tbd)    Recommendations for Other Services Rehab consult  Frequency Min 3X/week   Progress towards OT Goals Progress towards OT goals: Progressing toward goals  Plan Discharge plan remains appropriate    Precautions / Restrictions Precautions Precautions: Knee;Fall Precaution Booklet Issued: No Precaution Comments: Reviewed precautions Restrictions Weight Bearing Restrictions: Yes RLE Weight Bearing: Weight bearing as tolerated LLE Weight Bearing: Weight bearing as tolerated   Pertinent Vitals/Pain Pain 8/10. Nurse notified. Ankles propped on rolled towels.    ADL  Grooming: Performed;Wash/dry face;Set up Where Assessed - Grooming: Unsupported sitting Lower Body Dressing: Moderate assistance Where Assessed - Lower Body Dressing: Supported sit to Pharmacist, hospital: Min guard (had 2nd person for safety) Toilet Transfer Method: Sit to Barista: Bedside commode Toileting - Clothing Manipulation and Hygiene: Minimal assistance Where Assessed - Engineer, mining and Hygiene: Lean right and/or left;Other (comment) (sit to stand from chair) Equipment Used: Rolling walker;Reacher;Long-handled sponge;Long-handled shoe horn;Sock aid Transfers/Ambulation Related to ADLs: +2 Total A (50%) for sit to stand from recliner chair, however much improvement from [facility] (min guard-2nd person  for safety).  ADL Comments: Pt ambulated in room and sat on BSC. Pt leaning side to side to adjust gown, Pt did not have LB clothing to practice with, so simulated being able to let go of walker to pull up pants and pt able to do so (slightly off balance). Sockaid too small for pt's foot. Pt practiced doffing socks with reacher. OT educated on benefit of propping ankles.  Educated on AE for LB ADLs. Educated on chair push ups for UE strengthing.   OT Diagnosis:    OT Problem List:   OT Treatment Interventions:     OT Goals(current goals can now be found in the care plan section) Acute Rehab OT Goals Patient Stated Goal: not stated OT Goal Formulation: With patient Time For Goal Achievement: 03/13/13 Potential to Achieve Goals: Good ADL Goals Pt Will Perform Grooming: with min guard assist;standing Pt Will Perform Lower Body Bathing: with mod assist;with adaptive equipment;sit to/from stand Pt Will Perform Lower Body Dressing: with mod assist;with adaptive equipment;sit to/from stand Pt Will Perform Tub/Shower Transfer: Shower transfer;with min assist;rolling walker;ambulating Additional ADL Goal #1: Pt will complete all toileting on comfort commode with S and walker.  Visit Information  Last OT Received On: 02/28/13 Assistance Needed: +2 History of Present Illness: Patient is a 57 yo male s/p bil. TKA.    Subjective Data      Prior Functioning       Cognition  Cognition Arousal/Alertness: Awake/alert Behavior During Therapy: WFL for tasks assessed/performed Overall Cognitive Status: Within Functional Limits for tasks assessed    Mobility  Bed Mobility Bed Mobility: Not assessed Transfers Transfers: Sit to Stand;Stand to Sit Sit to Stand: 1: +2 Total assist;From chair/3-in-1;4: Min guard Stand to Sit: 4: Min assist;To chair/3-in-1 Details for Transfer Assistance: Cues for technique. Pt with much  improvement for sit to stand from [facility].  2nd person for safety with transfers  and ambulation.       Balance     End of Session OT - End of Session Equipment Utilized During Treatment: Gait belt;Rolling walker Activity Tolerance: Patient tolerated treatment well Patient left: in chair;with call bell/phone within reach Nurse Communication: Patient requests pain meds CPM Left Knee CPM Left Knee: Off CPM Right Knee CPM Right Knee: Off  GO     Earlie Raveling OTR/L [phone] 02/28/2013, 1:32 PM

## 2013-03-01 LAB — CBC
HCT: 26 % — ABNORMAL LOW (ref 39.0–52.0)
MCH: 33.8 pg (ref 26.0–34.0)
MCV: 98.9 fL (ref 78.0–100.0)
Platelets: 163 10*3/uL (ref 150–400)
RBC: 2.63 MIL/uL — ABNORMAL LOW (ref 4.22–5.81)
RDW: 13.3 % (ref 11.5–15.5)

## 2013-03-01 NOTE — Progress Notes (Signed)
Rehab admissions - [employer] will not approve acute inpatient rehab admission.  Insurance will approve SNF or HH therapies.  Call me for questions.  #[phone]

## 2013-03-01 NOTE — Progress Notes (Signed)
Occupational Therapy Treatment Patient Details Name: Johnny Brooks MRN: [id number] DOB: 1955/11/09 Today's Date: 03/01/2013 Time: 5621-3086 OT Time Calculation (min): 30 min  OT Assessment / Plan / Recommendation  History of present illness Patient is a 57 yo male s/p bil. TKA.   OT comments  This 57 yo male making progress, needs to be at a S level to go home with wife since she cannot be lifting or pulling on him. Would continue to benefit from an inpatient rehab stay.  Follow Up Recommendations  CIR;Supervision/Assistance - 24 hour       Equipment Recommendations  3 in 1 bedside comode;Other (comment)    Recommendations for Other Services Rehab consult  Frequency Min 3X/week   Progress towards OT Goals Progress towards OT goals: Progressing toward goals  Plan Discharge plan remains appropriate    Precautions / Restrictions Precautions Precautions: Knee;Fall Restrictions RLE Weight Bearing: Weight bearing as tolerated LLE Weight Bearing: Weight bearing as tolerated         OT Goals(current goals can now be found in the care plan section)    Visit Information  Last OT Received On: 03/01/13 Assistance Needed: +1 History of Present Illness: Patient is a 57 yo male s/p bil. TKA.          Cognition  Cognition Arousal/Alertness: Awake/alert Behavior During Therapy: WFL for tasks assessed/performed Overall Cognitive Status: Within Functional Limits for tasks assessed    Mobility  Bed Mobility Bed Mobility: Supine to Sit;Sitting - Scoot to Edge of Bed;Sit to Supine Supine to Sit: 5: Supervision;With rails;HOB elevated Sitting - Scoot to Edge of Bed: 5: Supervision;With rail Sit to Supine: 4: Min assist;HOB elevated (Bil LEs) Details for Bed Mobility Assistance: Pt with increased time for all Transfers Transfers: Sit to Stand;Stand to Sit Sit to Stand: 4: Min guard;With upper extremity assist;From bed Stand to Sit: 4: Min guard;With upper extremity assist;To  bed Details for Transfer Assistance: Worked with pt on multiple sit<>stands from varying heights (using the bed to do this since it can be adjusted). This way he can be able to get up from various heights that are needed for BADLs          End of Session OT - End of Session Equipment Utilized During Treatment: Gait belt;Rolling walker Activity Tolerance: Patient tolerated treatment well Patient left: in bed;with call bell/phone within reach;with chair alarm set       Evette Georges [phone] 03/01/2013, 3:23 PM

## 2013-03-01 NOTE — Clinical Social Work Psychosocial (Signed)
Clinical Social Work Department BRIEF PSYCHOSOCIAL ASSESSMENT 03/01/2013  Patient:  Johnny Brooks, Johnny Brooks     Account Number:  [id number]     Admit date:  02/26/2013  Clinical Social Worker:  Read Drivers  Date/Time:  03/01/2013 02:22 PM  Referred by:  Care Management  Date Referred:  03/01/2013 Referred for  SNF Placement   Other Referral:   none   Interview type:  Other - See comment Other interview type:   patient and wife    PSYCHOSOCIAL DATA Living Status:  WIFE Admitted from facility:   Level of care:   Primary support name:  [place] Primary support relationship to patient:  SPOUSE Degree of support available:   strong    CURRENT CONCERNS Current Concerns  Post-Acute Placement   Other Concerns:   none    SOCIAL WORK ASSESSMENT / PLAN CSW assessed pt at bedside.  Pt was alert and oriented.  Pt was upset at the denial that he had received from his insurance companu regarding CIR.  Pt contacted his human resource department regarding this issue.  Pt is agreeable to go to SNF as a backup for CIR.  Pt states that he cannot go home and that his wife is unable to lift him.  Pt and wife have no preference of facility at this point.  pt is agreeable to being faxed out to [facility].   Assessment/plan status:  Psychosocial Support/Ongoing Assessment of Needs Other assessment/ plan:   none   Information/referral to community resources:   SNF    PATIENT'S/FAMILY'S RESPONSE TO PLAN OF CARE: Pt and wife was appreciative of CSW support and assistance.       Vickii Penna, LCSWA [phone]  Clinical Social Work

## 2013-03-01 NOTE — Progress Notes (Signed)
Patient ID: DEZ STAUFFER, male   DOB: [birth year], 57 y.o.   MRN: [id number] PATIENT ID: KANE KUSEK  MRN: [id number]  DOB/AGE:  1955/08/17 / 57 y.o.  3 Days Post-Op Procedure(s) (LRB): TOTAL KNEE BILATERAL (Bilateral)    PROGRESS NOTE Subjective: Patient is alert, oriented, no Nausea, no Vomiting, yes passing gas, no Bowel Movement. Taking PO well. Denies SOB, Chest or Calf Pain. Using Incentive Spirometer, PAS in place. Ambulate 34ft, CPM 0-70 Patient reports pain as 4 on 0-10 scale and 5 on 0-10 scale  .    Objective: Vital signs in last 24 hours: Filed Vitals:   02/28/13 1406 02/28/13 2045 03/01/13 0028 03/01/13 0538  BP: 140/82 151/73  127/67  Pulse: 113 110  100  Temp: 98.3 F (36.8 C) 100.1 F (37.8 C) 99.2 F (37.3 C) 99.2 F (37.3 C)  TempSrc: Oral Oral Oral Oral  Resp: 20 20  18   Height:      Weight:      SpO2: 98% 98%  97%      Intake/Output from previous day: I/O last 3 completed shifts: In: 1440 [P.O.:1200; I.V.:240] Out: 1650 [Urine:1650]   Intake/Output this shift:     LABORATORY DATA:  Recent Labs  02/28/13 0412 03/01/13 0430  WBC 10.6* 8.7  HGB 9.6* 8.9*  HCT 28.1* 26.0*  PLT 173 163    Examination: Neurologically intact ABD soft Neurovascular intact Sensation intact distally Intact pulses distally Dorsiflexion/Plantar flexion intact Incision: no drainage No cellulitis present Compartment soft}  Assessment:   3 Days Post-Op Procedure(s) (LRB): TOTAL KNEE BILATERAL (Bilateral) ADDITIONAL DIAGNOSIS:    Plan: PT/OT WBAT, CPM 5/hrs day until ROM 0-90 degrees, then D/C CPM, hep lock IV change dressing DVT Prophylaxis:  SCDx72hrs, ASA 325 mg BID x 2 weeks DISCHARGE PLAN: Inpatient Rehab  DISCHARGE NEEDS: HHPT, HHRN, CPM, Walker and 3-in-1 comode seat     Edra Riccardi J 03/01/2013, 8:07 AM

## 2013-03-01 NOTE — Progress Notes (Signed)
Physical Therapy Treatment Patient Details Name: Johnny Brooks MRN: [id number] DOB: 12-27-1955 Today's Date: 03/01/2013 Time: 1191-4782 PT Time Calculation (min): 31 min  PT Assessment / Plan / Recommendation  History of Present Illness Patient is a 57 yo male s/p bil. TKA.   PT Comments   Pt progressing with mobility as he was able to increase ambulation distance today.   Per chart review, insurance did not approve CIR therefore pt will need SNF prior to d/c home to maximize independence with functional mobility due to wife can only provide Supervision.  D/c plans updated at this time.     Follow Up Recommendations  SNF     Does the patient have the potential to tolerate intense rehabilitation     Barriers to Discharge        Equipment Recommendations  Hospital bed;Rolling walker with 5" wheels;Wheelchair (measurements PT);Wheelchair cushion (measurements PT);3in1 (PT)    Recommendations for Other Services    Frequency 7X/week   Progress towards PT Goals Progress towards PT goals: Progressing toward goals  Plan Discharge plan needs to be updated    Precautions / Restrictions Precautions Precautions: Knee;Fall Precaution Comments: Provided education to patient. Restrictions Weight Bearing Restrictions: Yes RLE Weight Bearing: Weight bearing as tolerated LLE Weight Bearing: Weight bearing as tolerated       Mobility  Bed Mobility Bed Mobility: Not assessed Transfers Transfers: Sit to Stand;Stand to Sit Sit to Stand: 4: Min guard;With upper extremity assist;With armrests;From chair/3-in-1 Stand to Sit: 4: Min guard;With upper extremity assist;With armrests;To chair/3-in-1 Details for Transfer Assistance: cues to reinforce hand placement.   Ambulation/Gait Ambulation/Gait Assistance: 4: Min guard (+2 to follow with recliner) Ambulation Distance (Feet): 60 Feet Assistive device: Rolling walker Ambulation/Gait Assistance Details: Pt able to increase fluidity of gait  pattern when cued.   Gait Pattern: Decreased step length - right;Decreased step length - left;Decreased weight shift to right;Decreased weight shift to left Gait velocity: decreased General Gait Details: no knee buckling noted Stairs: No Wheelchair Mobility Wheelchair Mobility: No    Exercises Total Joint Exercises Ankle Circles/Pumps: AROM;Both;10 reps Quad Sets: AROM;Strengthening;Both;10 reps Heel Slides: AAROM;Strengthening;Both;10 reps (alternating; increased (A) for RLE) Straight Leg Raises: AAROM;Strengthening;Both;10 reps (alternating; increased (A) for RLE)     PT Goals (current goals can now be found in the care plan section) Acute Rehab PT Goals Patient Stated Goal: To do what I need to do for the best recovery. PT Goal Formulation: With patient Time For Goal Achievement: 03/12/13 Potential to Achieve Goals: Good  Visit Information  Last PT Received On: 03/01/13 Assistance Needed: +1 History of Present Illness: Patient is a 57 yo male s/p bil. TKA.    Subjective Data  Patient Stated Goal: To do what I need to do for the best recovery.   Cognition  Cognition Arousal/Alertness: Awake/alert Behavior During Therapy: WFL for tasks assessed/performed Overall Cognitive Status: Within Functional Limits for tasks assessed    Balance     End of Session PT - End of Session Equipment Utilized During Treatment: Gait belt Activity Tolerance: Patient tolerated treatment well Patient left: in chair;with call bell/phone within reach Nurse Communication: Mobility status CPM Left Knee CPM Left Knee: On Left Knee Flexion (Degrees): 60 Left Knee Extension (Degrees): 0 CPM Right Knee CPM Right Knee: Off   GP     [name] 03/01/2013, 11:50 AM  Verdell Face, PTA [phone] 03/01/2013

## 2013-03-02 NOTE — Discharge Summary (Signed)
Patient ID: Johnny Brooks MRN: [id number] DOB/AGE: 1955-08-19 57 y.o.  Admit date: 02/26/2013 Discharge date: 03/02/2013  Admission Diagnoses:  Active Problems:   Arthritis of knee   Discharge Diagnoses:  Same  Past Medical History  Diagnosis Date  . Arthritis     knees  . Left bundle branch block     Surgeries: Procedure(s): TOTAL KNEE BILATERAL on 02/26/2013   Consultants:    Discharged Condition: Improved  Hospital Course: Johnny Brooks is an 57 y.o. male who was admitted 02/26/2013 for operative treatment of<principal problem not specified>. Patient has severe unremitting pain that affects sleep, daily activities, and work/hobbies. After pre-op clearance the patient was taken to the operating room on 02/26/2013 and underwent  Procedure(s): TOTAL KNEE BILATERAL.    Patient was given perioperative antibiotics: Anti-infectives   Start     Dose/Rate Route Frequency Ordered Stop   02/26/13 1042  cefUROXime (ZINACEF) injection  Status:  Discontinued       As needed 02/26/13 1043 02/26/13 1246   02/26/13 0600  ceFAZolin (ANCEF) 3 g in dextrose 5 % 50 mL IVPB     3 g 160 mL/hr over 30 Minutes Intravenous On call to O.R. 02/25/13 1414 02/26/13 1057       Patient was given sequential compression devices, early ambulation, and chemoprophylaxis to prevent DVT.  Patient benefited maximally from hospital stay and there were no complications.    Recent vital signs: Patient Vitals for the past 24 hrs:  BP Temp Temp src Pulse Resp SpO2  03/02/13 0737 - - - - 18 100 %  03/02/13 0653 125/71 mmHg 99.3 F (37.4 C) Oral 95 18 98 %  03/01/13 2221 135/74 mmHg 98.9 F (37.2 C) - 85 18 99 %  03/01/13 1600 - - - - 18 98 %  03/01/13 1344 131/77 mmHg 99.5 F (37.5 C) Oral 112 18 99 %  03/01/13 1112 - - - - 16 99 %     Recent laboratory studies:  Recent Labs  02/28/13 0412 03/01/13 0430  WBC 10.6* 8.7  HGB 9.6* 8.9*  HCT 28.1* 26.0*  PLT 173 163     Discharge Medications:      Medication List    STOP taking these medications       ibuprofen 200 MG tablet  Commonly known as:  ADVIL,MOTRIN      TAKE these medications       aspirin EC 325 MG tablet  Take 1 tablet (325 mg total) by mouth 2 (two) times daily.     CO Q-10 PO  Take 1 tablet by mouth daily.     fluticasone 50 MCG/ACT nasal spray  Commonly known as:  FLONASE  Place 1 spray into both nostrils daily as needed for allergies or rhinitis.     loratadine 10 MG tablet  Commonly known as:  CLARITIN  Take 10 mg by mouth daily as needed for allergies.     methocarbamol 500 MG tablet  Commonly known as:  ROBAXIN  Take 1 tablet (500 mg total) by mouth 2 (two) times daily with a meal.     OVER THE COUNTER MEDICATION  Take 2 capsules by mouth daily. Testosterone supplement     oxyCODONE-acetaminophen 5-325 MG per tablet  Commonly known as:  ROXICET  Take 1 tablet by mouth every 4 (four) hours as needed.     PROSTATE PO  Take 1 tablet by mouth daily.     TURMERIC PO  Take by mouth.  Diagnostic Studies: Dg Chest 2 View  02/19/2013   CLINICAL DATA:  Preoperative evaluation for knee replacement  EXAM: CHEST  2 VIEW  COMPARISON:  None.  FINDINGS: The heart and pulmonary vascularity are within normal limits. The lungs are clear bilaterally. No acute bony abnormality is seen. Mild degenerative change of the thoracic spine is noted.  IMPRESSION: No acute abnormality seen.   Electronically Signed   By: Alcide Clever M.D.   On: 02/19/2013 15:24    Disposition: Final discharge disposition not confirmed      Discharge Orders   Future Orders Complete By Expires   Call MD / Call 911  As directed    Comments:     If you experience chest pain or shortness of breath, CALL 911 and be transported to the hospital emergency room.  If you develope a fever above 101 F, pus (white drainage) or increased drainage or redness at the wound, or calf pain, call your surgeon's office.   Change dressing  As  directed    Comments:     Change dressing on 5, then change the dressing daily with sterile 4 x 4 inch gauze dressing and apply TED hose.  You may clean the incision with alcohol prior to redressing.   Constipation Prevention  As directed    Comments:     Drink plenty of fluids.  Prune juice may be helpful.  You may use a stool softener, such as Colace (over the counter) 100 mg twice a day.  Use MiraLax (over the counter) for constipation as needed.   CPM  As directed    Comments:     Continuous passive motion machine (CPM):      Use the CPM from 0 to 60  for 5 hours per day.      You may increase by 10 degrees per day.  You may break it up into 2 or 3 sessions per day.      Use CPM for 2 weeks or until you are told to stop.   Diet - low sodium heart healthy  As directed    Discharge instructions  As directed    Comments:     Follow up in office with Dr. Turner Daniels in 2 weeks.   Driving restrictions  As directed    Comments:     No driving for 2 weeks   Increase activity slowly as tolerated  As directed       Follow-up Information   Follow up with Nestor Lewandowsky, MD In 2 weeks.   Specialty:  Orthopedic Surgery   Contact information:   [address] [place] [address] [phone]        Signed: Henry Russel 03/02/2013, 10:54 AM

## 2013-03-02 NOTE — Clinical Social Work Note (Signed)
CSW presented bed offers to pt.  Pt chooses [place], [place].  [facility] has offered a bed and a representative will be at bedside a little later this morning to complete admissions paperwork.  CSW will arrange transportation around 12:30pm via EMS.  RN aware.  CSW provided disposition update to PA.  Vickii Penna, LCSWA [phone]  Clinical Social Work

## 2013-03-02 NOTE — Progress Notes (Signed)
Physical Therapy Treatment Patient Details Name: Johnny Brooks MRN: [id number] DOB: November 27, 1955 Today's Date: 03/02/2013 Time: 4098-1191 PT Time Calculation (min): 38 min  PT Assessment / Plan / Recommendation  History of Present Illness Patient is a 57 yo male s/p bil. TKA.   PT Comments   Pt cont's to progress with mobility.    Follow Up Recommendations  SNF     Does the patient have the potential to tolerate intense rehabilitation     Barriers to Discharge        Equipment Recommendations  Hospital bed;Rolling walker with 5" wheels;Wheelchair (measurements PT);Wheelchair cushion (measurements PT);3in1 (PT)    Recommendations for Other Services    Frequency 7X/week   Progress towards PT Goals Progress towards PT goals: Progressing toward goals  Plan Current plan remains appropriate    Precautions / Restrictions Precautions Precautions: Knee;Fall Restrictions RLE Weight Bearing: Weight bearing as tolerated LLE Weight Bearing: Weight bearing as tolerated       Mobility  Bed Mobility Bed Mobility: Not assessed Transfers Transfers: Sit to Stand;Stand to Sit Sit to Stand: 4: Min guard;With upper extremity assist;With armrests;From bed;From chair/3-in-1 Stand to Sit: 4: Min guard;With upper extremity assist;With armrests;To bed;To chair/3-in-1 Details for Transfer Assistance: Pt slides bil LE's out as he uses UE's to control descent.   Ambulation/Gait Ambulation/Gait Assistance: 4: Min guard Ambulation Distance (Feet): 120 Feet Assistive device: Rolling walker Ambulation/Gait Assistance Details: cues to stay closer to RW, tall posture, look ahead, & decrease reliance of UE's on RW.   Gait Pattern: Step-through pattern;Decreased step length - right;Decreased step length - left;Trunk flexed;Antalgic;Decreased weight shift to right;Decreased weight shift to left Stairs: No Wheelchair Mobility Wheelchair Mobility: No    Exercises Total Joint Exercises Ankle  Circles/Pumps: AROM;Left;15 reps Quad Sets: AROM;Strengthening;Both;10 reps Knee Flexion: AAROM;Both;5 reps;Seated     PT Goals (current goals can now be found in the care plan section) Acute Rehab PT Goals PT Goal Formulation: With patient Time For Goal Achievement: 03/12/13 Potential to Achieve Goals: Good  Visit Information  Last PT Received On: 03/02/13 Assistance Needed: +1 History of Present Illness: Patient is a 57 yo male s/p bil. TKA.    Subjective Data      Cognition  Cognition Arousal/Alertness: Awake/alert Behavior During Therapy: WFL for tasks assessed/performed Overall Cognitive Status: Within Functional Limits for tasks assessed    Balance     End of Session PT - End of Session Activity Tolerance: Patient tolerated treatment well Patient left: in chair;with call bell/phone within reach;with family/visitor present Nurse Communication: Mobility status   GP     Johnny Brooks 03/02/2013, 1:22 PM   Verdell Face, PTA [phone] 03/02/2013

## 2013-03-02 NOTE — Progress Notes (Signed)
PATIENT ID: Johnny Brooks  MRN: [id number]  DOB/AGE:  [birth year] / 57 y.o.  4 Days Post-Op Procedure(s) (LRB): TOTAL KNEE BILATERAL (Bilateral)    PROGRESS NOTE Subjective: Patient is alert, oriented, no Nausea, no Vomiting, yes passing gas, no Bowel Movement. Taking PO well. Denies SOB, Chest or Calf Pain. Using Incentive Spirometer, PAS in place. Ambulate WBAT, CPM 0-60 Patient reports pain as mild  .    Objective: Vital signs in last 24 hours: Filed Vitals:   03/01/13 1600 03/01/13 2221 03/02/13 0653 03/02/13 0737  BP:  135/74 125/71   Pulse:  85 95   Temp:  98.9 F (37.2 C) 99.3 F (37.4 C)   TempSrc:   Oral   Resp: 18 18 18 18   Height:      Weight:      SpO2: 98% 99% 98% 100%      Intake/Output from previous day: I/O last 3 completed shifts: In: 1200 [P.O.:1200] Out: 1450 [Urine:1450]   Intake/Output this shift:     LABORATORY DATA:  Recent Labs  02/28/13 0412 03/01/13 0430  WBC 10.6* 8.7  HGB 9.6* 8.9*  HCT 28.1* 26.0*  PLT 173 163    Examination: Neurologically intact Neurovascular intact Sensation intact distally Intact pulses distally Dorsiflexion/Plantar flexion intact Incision: dressing C/D/I No cellulitis present Compartment soft}  Assessment:   4 Days Post-Op Procedure(s) (LRB): TOTAL KNEE BILATERAL (Bilateral) ADDITIONAL DIAGNOSIS:    Plan: PT/OT WBAT, CPM 5/hrs day until ROM 0-90 degrees, then D/C CPM DVT Prophylaxis:  SCDx72hrs, ASA 325 mg BID x 2 weeks DISCHARGE PLAN: Skilled Nursing Facility/Rehab when pt cleared by PT.  Insurance Denied CIR DISCHARGE NEEDS: HHPT, HHRN, CPM, Walker and 3-in-1 comode seat     Frankee Gritz R 03/02/2013, 8:48 AM

## 2013-03-02 NOTE — Clinical Social Work Placement (Signed)
Clinical Social Work Department CLINICAL SOCIAL WORK PLACEMENT NOTE 03/02/2013  Patient:  Johnny Brooks, Johnny Brooks  Account Number:  [id number] Admit date:  02/26/2013  Clinical Social Worker:  Read Drivers  Date/time:  03/02/2013 11:28 AM  Clinical Social Work is seeking post-discharge placement for this patient at the following level of care:   SKILLED NURSING   (*CSW will update this form in Epic as items are completed)   03/01/2013  Patient/family provided with [facility] Department of Clinical Social Work's list of facilities offering this level of care within the geographic area requested by the patient (or if unable, by the patient's family).  03/01/2013  Patient/family informed of their freedom to choose among providers that offer the needed level of care, that participate in Medicare, Medicaid or managed care program needed by the patient, have an available bed and are willing to accept the patient.  03/01/2013  Patient/family informed of [facility]' ownership interest in [facility], as well as of the fact that they are under no obligation to receive care at this facility.  PASARR submitted to EDS on 03/01/2013 PASARR number received from EDS on 03/01/2013  FL2 transmitted to all facilities in geographic area requested by pt/family on  03/01/2013 FL2 transmitted to all facilities within larger geographic area on   Patient informed that his/her managed care company has contracts with or will negotiate with  certain facilities, including the following:     Patient/family informed of bed offers received:  03/02/2013 Patient chooses bed at [facility] Physician recommends and patient chooses bed at  [facility]  Patient to be transferred to [facility] on  03/02/2013 Patient to be transferred to facility by [facility]  The following physician request were entered in Epic:   Additional Comments: Vickii Penna, LCSWA [phone]  Clinical Social Work

## 2013-03-05 ENCOUNTER — Non-Acute Institutional Stay (SKILLED_NURSING_FACILITY): Payer: 59 | Admitting: Adult Health

## 2013-03-05 DIAGNOSIS — M1710 Unilateral primary osteoarthritis, unspecified knee: Secondary | ICD-10-CM

## 2013-03-05 DIAGNOSIS — R52 Pain, unspecified: Secondary | ICD-10-CM

## 2013-03-05 DIAGNOSIS — K5900 Constipation, unspecified: Secondary | ICD-10-CM

## 2013-03-05 DIAGNOSIS — R21 Rash and other nonspecific skin eruption: Secondary | ICD-10-CM

## 2013-03-05 LAB — TYPE AND SCREEN
Antibody Screen: NEGATIVE
Unit division: 0

## 2013-03-07 ENCOUNTER — Non-Acute Institutional Stay (SKILLED_NURSING_FACILITY): Payer: 59 | Admitting: Internal Medicine

## 2013-03-07 DIAGNOSIS — R252 Cramp and spasm: Secondary | ICD-10-CM

## 2013-03-07 DIAGNOSIS — J309 Allergic rhinitis, unspecified: Secondary | ICD-10-CM

## 2013-03-07 DIAGNOSIS — D62 Acute posthemorrhagic anemia: Secondary | ICD-10-CM

## 2013-03-07 DIAGNOSIS — R52 Pain, unspecified: Secondary | ICD-10-CM

## 2013-03-13 ENCOUNTER — Non-Acute Institutional Stay (SKILLED_NURSING_FACILITY): Payer: 59 | Admitting: Adult Health

## 2013-03-13 DIAGNOSIS — R52 Pain, unspecified: Secondary | ICD-10-CM | POA: Insufficient documentation

## 2013-03-13 DIAGNOSIS — J309 Allergic rhinitis, unspecified: Secondary | ICD-10-CM

## 2013-03-13 DIAGNOSIS — R21 Rash and other nonspecific skin eruption: Secondary | ICD-10-CM | POA: Insufficient documentation

## 2013-03-13 DIAGNOSIS — D62 Acute posthemorrhagic anemia: Secondary | ICD-10-CM

## 2013-03-13 DIAGNOSIS — M1710 Unilateral primary osteoarthritis, unspecified knee: Secondary | ICD-10-CM

## 2013-03-13 DIAGNOSIS — K5900 Constipation, unspecified: Secondary | ICD-10-CM

## 2013-03-13 NOTE — Progress Notes (Signed)
Patient ID: Johnny Brooks, male   DOB: 22-Nov-1955, 57 y.o.   MRN: [id number]                          PROGRESS NOTE  DATE: 03/05/2013  FACILITY: Nursing Home Location: [facility]  LEVEL OF CARE: SNF (31)  Acute Visit  CHIEF COMPLAINT:  Follow-up hospitalization  HISTORY OF PRESENT ILLNESS: This is a 57 year old male who has been admitted to [facility] on 03/02/13 from [facility] with Arthritis S/P Bilateral Knee arthroplasty. He has been admitted for a short-term rehabilitation.  REASSESSMENT OF ONGOING PROBLEM(S):  ALLERGIC RHINITIS: Allergic rhinitis remains stable.  Patient denies ongoing symptoms such as runny nose sneezing or tearing. No complications reported from the current medication(s) being used.  CONSTIPATION: The constipation remains stable. No complications from the medications presently being used. Patient denies ongoing constipation, abdominal pain, nausea or vomiting.   PAST MEDICAL HISTORY : Reviewed.  No changes.  CURRENT MEDICATIONS: Reviewed per [facility]  REVIEW OF SYSTEMS:  GENERAL: no change in appetite, no fatigue, no weight changes, no fever, chills or weakness RESPIRATORY: no cough, SOB, DOE, wheezing, hemoptysis CARDIAC: no chest pain, edema or palpitations GI: no abdominal pain, diarrhea, constipation, heart burn, nausea or vomiting  PHYSICAL EXAMINATION  VS:  T99.2       P92      RR18      BP117/72         WT285 (Lb)  GENERAL: no acute distress, normal body habitus SKIN: erythematous rashes on back EYES: conjunctivae normal, sclerae normal, normal eye lids NECK: supple, trachea midline, no neck masses, no thyroid tenderness, no thyromegaly LYMPHATICS: no LAN in the neck, no supraclavicular LAN RESPIRATORY: breathing is even & unlabored, BS CTAB CARDIAC: RRR, no murmur,no extra heart sounds, no edema GI: abdomen soft, normal BS, no masses, no tenderness, no hepatomegaly, no splenomegaly PSYCHIATRIC: the patient is  alert & oriented to person, affect & behavior appropriate  LABS/RADIOLOGY: 03/01/13 WBC 8.7 hemoglobin 8.9 hematocrit 26.0 02/19/13 sodium 139 potassium 4.2 glucose 90 BUN 10 creatinine 0.96 calcium 8.9    ASSESSMENT/PLAN:  Arthritis of knees status post bilateral total knee arthroplasty - for rehabilitation  Rashes on back - up try hydrocortisone 1% cream to rashes on back twice a day x2 weeks  Pain, bilateral knee - continue Percocet 5/325 mg 1-2 tabs by mouth every 4 hours when necessary  Constipation - no complaints   CPT CODE: 40981

## 2013-03-13 NOTE — Progress Notes (Signed)
Patient ID: Johnny Brooks, male   DOB: 1956/01/06, 57 y.o.   MRN: [id number]              PROGRESS NOTE  DATE: 03/13/2013   FACILITY: [facility]  LEVEL OF CARE: SNF (31)  Acute Visit  CHIEF COMPLAINT:  Discharge Notes  HISTORY OF PRESENT ILLNESS: This is a 57 year old male who is for discharge home with Home health PT. DME: Rolling walker due to unsteady gait. He has been admitted to [facility] on 03/02/13 from [facility] with Arthritis S/P Bilateral Knee arthroplasty. Patient was admitted to this facility for short-term rehabilitation after the patient's recent hospitalization.  Patient has completed SNF rehabilitation and therapy has cleared the patient for discharge.  Reassessment of ongoing problem(s):  CONSTIPATION: The constipation remains stable. No complications from the medications presently being used. Patient denies ongoing constipation, abdominal pain, nausea or vomiting.  ANEMIA: The anemia has been stable. The patient denies fatigue, melena or hematochezia. No complications from the medications currently being used. 12/14 hgb 9.9  ALLERGIC RHINITIS: Allergic rhinitis remains stable.  Patient denies ongoing symptoms such as runny nose sneezing or tearing. No complications reported from the current medication(s) being used.  PAST MEDICAL HISTORY : Reviewed.  No changes.  CURRENT MEDICATIONS: Reviewed per [facility]  REVIEW OF SYSTEMS:  GENERAL: no change in appetite, no fatigue, no weight changes, no fever, chills or weakness RESPIRATORY: no cough, SOB, DOE, wheezing, hemoptysis CARDIAC: no chest pain, edema or palpitations GI: no abdominal pain, diarrhea, constipation, heart burn, nausea or vomiting  PHYSICAL EXAMINATION  VS:  T98.1       P92       RR20      BP125/83      POX 98%       WT 279.4(Lb)  GENERAL: no acute distress, normal body habitus NECK: supple, trachea midline, no neck masses, no thyroid tenderness, no thyromegaly LYMPHATICS:  no LAN in the neck, no supraclavicular LAN RESPIRATORY: breathing is even & unlabored, BS CTAB CARDIAC: RRR, no murmur,no extra heart sounds, no edema GI: abdomen soft, normal BS, no masses, no tenderness, no hepatomegaly, no splenomegaly PSYCHIATRIC: the patient is alert & oriented to person, affect & behavior appropriate  LABS/RADIOLOGY: 03/08/13 WBC 7.4 hemoglobin 9.9 hematocrit 32.1 sodium 135 potassium 4.2 glucose 143 BUN 11 creatinine 0.8 calcium 8.4 03/01/13 WBC 8.7 hemoglobin 8.9 hematocrit 26.0 02/19/13 sodium 139 potassium 4.2 glucose 90 BUN 10 creatinine 0.96 calcium 8.9    ASSESSMENT/PLAN:  Arthritis of knees status post bilateral total knee arthroplasty - for Home health PT  Pain, bilateral knee - stable; continue Percocet PRN  Constipation - no complaints; continue Colace  Allergic rhinitis - stable  Anemia, acute blood loss - stable   I have filled out patient's discharge paperwork and written prescriptions.  Patient will receive home health PT. DME provided: Rolling-walker  Total discharge time: Greater than 30 minutes Discharge time involved coordination of the discharge process with social worker, nursing staff and therapy department. Medical justification for home health services/DME verified.  CPT CODE: 40981

## 2013-04-20 ENCOUNTER — Other Ambulatory Visit: Payer: Self-pay | Admitting: Adult Health

## 2013-05-10 NOTE — Progress Notes (Signed)
Patient ID: Johnny Brooks, male   DOB: 03/06/1956, 58 y.o.   MRN: [id number]               HISTORY & PHYSICAL  DATE: 03/07/2013     FACILITY: [facility]  LEVEL OF CARE: SNF (31)  ALLERGIES:  No Known Allergies  CHIEF COMPLAINT:  Manage bilateral knee osteoarthritis, allergic rhinitis, and acute blood loss anemia.    HISTORY OF PRESENT ILLNESS:  The patient is a 58 year-old, Caucasian male.    KNEE OSTEOARTHRITIS:  Patient is status post bilateral total knee arthroplasty.  Patient had a history of pain and functional disability in the knee due to end-stage osteoarthritis and has failed nonsurgical conservative treatments. Patient had worsening of pain with activity and weight bearing, pain that interfered with activities of daily living & pain with passive range of motion. Therefore patient underwent total knee arthroplasty and tolerated the procedure well. Patient is admitted to this facility for sort short-term rehabilitation. Patient denies knee pain.    ALLERGIC RHINITIS: Allergic rhinitis remains stable. Patient denies ongoing symptoms such as runny nose, sneezing or tearing. No complications reported from the current medication(s) being used.    ANEMIA:  Postoperatively, patient suffered acute blood loss.   The anemia has been stable. The patient denies fatigue, melena or hematochezia. No complications from the medications currently being used.  Last hemoglobins are:    8.9, 9.6.    PAST MEDICAL HISTORY :  Past Medical History  Diagnosis Date  . Arthritis     knees  . Left bundle branch block     PAST SURGICAL HISTORY: Past Surgical History  Procedure Laterality Date  . Knee surgery      left  1973  . Arthroscopies      numerous on knees  . Dislocated clavicle      right  . Ankle surgery      right  . Total knee arthroplasty Bilateral 02/26/2013    Procedure: TOTAL KNEE BILATERAL;  Surgeon: Nestor LewandowskyFrank J Rowan, MD;  Location: [facility] OR;  Service: Orthopedics;   Laterality: Bilateral;    SOCIAL HISTORY:  reports that he has been smoking Cigars.  He does not have any smokeless tobacco history on file. He reports that he drinks about 1.8 ounces of alcohol per week. He reports that he does not use illicit drugs.  FAMILY HISTORY: None  CURRENT MEDICATIONS: Reviewed per [facility]  REVIEW OF SYSTEMS:  See HPI otherwise 14 point ROS is negative.  PHYSICAL EXAMINATION  VS:  T 97.6       P 102      RR 20      BP 143/90      POX 96%          GENERAL: no acute distress, moderately obese body habitus EYES: conjunctivae normal, sclerae normal, normal eye lids MOUTH/THROAT: lips without lesions,no lesions in the mouth,tongue is without lesions,uvula elevates in midline NECK: supple, trachea midline, no neck masses, no thyroid tenderness, no thyromegaly LYMPHATICS: no LAN in the neck, no supraclavicular LAN RESPIRATORY: breathing is even & unlabored, BS CTAB CARDIAC: RRR, no murmur,no extra heart sounds EDEMA/VARICOSITIES:  +3 bilateral lower extremity edema  ARTERIAL:  pedal pulses nonpalpable   GI:  ABDOMEN: abdomen soft, normal BS, no masses, no tenderness  LIVER/SPLEEN: no hepatomegaly, no splenomegaly MUSCULOSKELETAL: HEAD: normal to inspection & palpation BACK: no kyphosis, scoliosis or spinal processes tenderness EXTREMITIES: LEFT UPPER EXTREMITY: full range of motion, normal strength &  tone RIGHT UPPER EXTREMITY:  full range of motion, normal strength & tone LEFT LOWER EXTREMITY: strength intact, range of motion not tested due to surgery   RIGHT LOWER EXTREMITY: strength intact, range of motion not tested due to surgery   PSYCHIATRIC: the patient is alert & oriented to person, affect & behavior appropriate  LABS/RADIOLOGY: Chest x-ray:  No acute disease.    Labs reviewed: Basic Metabolic Panel:  Recent Labs  44/01/02 1112  NA 139  K 4.2  CL 105  CO2 24  GLUCOSE 90  BUN 10  CREATININE 0.96  CALCIUM 8.9   CBC:  Recent Labs   02/19/13 1112 02/27/13 0505 02/28/13 0412 03/01/13 0430  WBC 5.3 8.6 10.6* 8.7  NEUTROABS 2.4  --   --   --   HGB 13.3 10.3* 9.6* 8.9*  HCT 37.4* 29.7* 28.1* 26.0*  MCV 95.7 98.0 99.3 98.9  PLT 220 214 173 163   ASSESSMENT/PLAN:  Bilateral knee osteoarthritis.  Status post bilateral total knee arthroplasty.  Continue rehabilitation.    Allergic rhinitis.  Well controlled.    Acute blood loss anemia.  Reassess hemoglobin level.    Muscle cramping.  Continue Robaxin.    Check CBC and BMP.    THN Metrics:   Aspirin 650 mg q.d.  Current smoker.    I have reviewed patient's medical records received at admission/from hospitalization.  CPT CODE: 72536

## 2013-05-11 ENCOUNTER — Encounter: Payer: Self-pay | Admitting: Internal Medicine

## 2013-05-11 DIAGNOSIS — D62 Acute posthemorrhagic anemia: Secondary | ICD-10-CM | POA: Insufficient documentation

## 2013-05-11 DIAGNOSIS — J309 Allergic rhinitis, unspecified: Secondary | ICD-10-CM | POA: Insufficient documentation

## 2013-05-11 DIAGNOSIS — R252 Cramp and spasm: Secondary | ICD-10-CM | POA: Insufficient documentation

## 2014-09-06 ENCOUNTER — Other Ambulatory Visit: Payer: Self-pay | Admitting: Family Medicine

## 2014-09-06 DIAGNOSIS — R0982 Postnasal drip: Secondary | ICD-10-CM

## 2014-09-10 ENCOUNTER — Other Ambulatory Visit: Payer: Self-pay

## 2019-06-05 ENCOUNTER — Telehealth: Payer: Self-pay

## 2019-06-05 NOTE — Telephone Encounter (Signed)
Called patient to do their pre-visit COVID screening.  Call went to voicemail. Unable to do prescreening.  

## 2019-06-06 ENCOUNTER — Ambulatory Visit (INDEPENDENT_AMBULATORY_CARE_PROVIDER_SITE_OTHER): Payer: Medicare Other | Admitting: Internal Medicine

## 2019-06-06 ENCOUNTER — Encounter: Payer: Self-pay | Admitting: Internal Medicine

## 2019-06-06 ENCOUNTER — Other Ambulatory Visit: Payer: Self-pay

## 2019-06-06 VITALS — BP 123/81 | HR 95 | Temp 97.3°F | Resp 17 | Ht 69.5 in | Wt 271.0 lb

## 2019-06-06 DIAGNOSIS — Z13228 Encounter for screening for other metabolic disorders: Secondary | ICD-10-CM

## 2019-06-06 DIAGNOSIS — Z8042 Family history of malignant neoplasm of prostate: Secondary | ICD-10-CM

## 2019-06-06 DIAGNOSIS — L989 Disorder of the skin and subcutaneous tissue, unspecified: Secondary | ICD-10-CM

## 2019-06-06 DIAGNOSIS — Z1211 Encounter for screening for malignant neoplasm of colon: Secondary | ICD-10-CM | POA: Diagnosis not present

## 2019-06-06 DIAGNOSIS — Z1159 Encounter for screening for other viral diseases: Secondary | ICD-10-CM

## 2019-06-06 DIAGNOSIS — Z114 Encounter for screening for human immunodeficiency virus [HIV]: Secondary | ICD-10-CM | POA: Diagnosis not present

## 2019-06-06 DIAGNOSIS — Z7689 Persons encountering health services in other specified circumstances: Secondary | ICD-10-CM | POA: Diagnosis not present

## 2019-06-06 NOTE — Progress Notes (Signed)
Subjective:    Johnny Brooks - 64 y.o. male MRN [id number]  Date of birth: [birth year]  HPI  Johnny Brooks is to establish care. Patient has a PMH significant for LBBB, OA with history of knee replacement bilaterally. Has not seen a PCP in some time. Had [place] in July, recovered well. Plans to receive vaccination tomorrow.   Has two skin lesions that he is concerned about. One on the right forehead and one on back. The one on his forehead was frozen off and the lesion returned. That area has always been sensitive, that comes and goes. Forehead present for 10 years. The one on his back he isn't sure about but it itches. No personal history of skin cancer. Father had melonoma Patient reports diligent sunscreen use.     ROS per HPI     Health Maintenance:  Health Maintenance Due  Topic Date Due  . Hepatitis C Screening  [birth year]  . HIV Screening  06/04/1969  . TETANUS/TDAP  06/04/1973  . COLONOSCOPY  06/04/2004  . PNA vac Low Risk Adult (1 of 2 - PCV13) 06/05/2019     Past Medical History: Patient Active Problem List   Diagnosis Date Noted  . Allergic rhinitis, cause unspecified 05/11/2013  . Arthritis of knee 02/26/2013  . Left bundle branch block 02/20/2013      Social History   reports that he has quit smoking. His smoking use included cigars. He has never used smokeless tobacco. He reports current alcohol use of about 3.0 standard drinks of alcohol per week. He reports that he does not use drugs.   Family History  family history is not on file.   Medications: reviewed and updated   Objective:   Physical Exam BP 123/81   Pulse 95   Temp (!) 97.3 F (36.3 C) (Temporal)   Resp 17   Ht 5' 9.5" (1.765 m)   Wt 271 lb (122.9 kg)   SpO2 96%   BMI 39.45 kg/m  Physical Exam  Constitutional: He is oriented to person, place, and time and well-developed, well-nourished, and in no distress. No distress.  HENT:  Head: Normocephalic and atraumatic.  Eyes:  Conjunctivae and EOM are normal.  Cardiovascular: Normal rate, regular rhythm and normal heart sounds.  No murmur heard. Pulmonary/Chest: Effort normal and breath sounds normal. No respiratory distress.  Musculoskeletal:        General: Normal range of motion.  Neurological: He is alert and oriented to person, place, and time.  Skin: Skin is warm and dry. He is not diaphoretic.  Hyperpigmented slightly raised lesion <1 cm diameter on right forehead with irregular borders. No abnormal appearing lesions noted on the back.   Psychiatric: Affect and judgment normal.        Assessment & Plan:   1. Encounter to establish care   2. Need for hepatitis C screening test - Hepatitis C Antibody  3. Screening for HIV (human immunodeficiency virus) - HIV antibody (with reflex)  4. Colon cancer screening Patient reports he had a colonoscopy done about 5-6 years ago. Had one prior with polyps removed. Second was normal. Unsure which group completed this.   5. Skin lesion Due to significant size and irregular borders, would like dermatology to evaluate.  - Ambulatory referral to Dermatology  6. Screening for metabolic disorder - CBC with Differential - Comprehensive metabolic panel  7. Family history of prostate cancer Patient requests PSA today.  - PSA     Phill Myron, D.O. 06/06/2019,  2:04 PM Primary Care at [facility]

## 2019-06-06 NOTE — Patient Instructions (Signed)
Thank you for choosing Primary Care at [facility] to be your medical home!    Johnny Brooks was seen by De Hollingshead, DO today.   Johnny Brooks's primary care provider is Marcy Siren, DO.   For the best care possible, you should try to see Marcy Siren, DO whenever you come to the clinic.   We look forward to seeing you again soon!  If you have any questions about your visit today, please call us at [phone] or feel free to reach your primary care provider via MyChart.

## 2019-06-07 LAB — CBC WITH DIFFERENTIAL/PLATELET
Basophils Absolute: 0.1 10*3/uL (ref 0.0–0.2)
Basos: 1 %
EOS (ABSOLUTE): 0.2 10*3/uL (ref 0.0–0.4)
Eos: 2 %
Hematocrit: 45.6 % (ref 37.5–51.0)
Hemoglobin: 15.7 g/dL (ref 13.0–17.7)
Immature Grans (Abs): 0 10*3/uL (ref 0.0–0.1)
Immature Granulocytes: 0 %
Lymphocytes Absolute: 2.9 10*3/uL (ref 0.7–3.1)
Lymphs: 40 %
MCH: 34.1 pg — ABNORMAL HIGH (ref 26.6–33.0)
MCHC: 34.4 g/dL (ref 31.5–35.7)
MCV: 99 fL — ABNORMAL HIGH (ref 79–97)
Monocytes Absolute: 0.6 10*3/uL (ref 0.1–0.9)
Monocytes: 9 %
Neutrophils Absolute: 3.6 10*3/uL (ref 1.4–7.0)
Neutrophils: 48 %
Platelets: 253 10*3/uL (ref 150–450)
RBC: 4.61 x10E6/uL (ref 4.14–5.80)
RDW: 12.1 % (ref 11.6–15.4)
WBC: 7.4 10*3/uL (ref 3.4–10.8)

## 2019-06-07 LAB — COMPREHENSIVE METABOLIC PANEL
ALT: 39 IU/L (ref 0–44)
AST: 30 IU/L (ref 0–40)
Albumin/Globulin Ratio: 2.1 (ref 1.2–2.2)
Albumin: 4.9 g/dL — ABNORMAL HIGH (ref 3.8–4.8)
Alkaline Phosphatase: 57 IU/L (ref 39–117)
BUN/Creatinine Ratio: 12 (ref 10–24)
BUN: 13 mg/dL (ref 8–27)
Bilirubin Total: 0.5 mg/dL (ref 0.0–1.2)
CO2: 22 mmol/L (ref 20–29)
Calcium: 9.4 mg/dL (ref 8.6–10.2)
Chloride: 101 mmol/L (ref 96–106)
Creatinine, Ser: 1.06 mg/dL (ref 0.76–1.27)
GFR calc Af Amer: 85 mL/min/{1.73_m2} (ref >59)
GFR calc non Af Amer: 73 mL/min/{1.73_m2} (ref >59)
Globulin, Total: 2.3 g/dL (ref 1.5–4.5)
Glucose: 94 mg/dL (ref 65–99)
Potassium: 4.3 mmol/L (ref 3.5–5.2)
Sodium: 141 mmol/L (ref 134–144)
Total Protein: 7.2 g/dL (ref 6.0–8.5)

## 2019-06-07 LAB — HIV ANTIBODY (ROUTINE TESTING W REFLEX): HIV Screen 4th Generation wRfx: NONREACTIVE

## 2019-06-07 LAB — HEPATITIS C ANTIBODY: Hep C Virus Ab: <0.1 s/co ratio (ref 0.0–0.9)

## 2019-06-07 LAB — PSA: Prostate Specific Ag, Serum: 0.5 ng/mL (ref 0.0–4.0)

## 2019-06-08 NOTE — Progress Notes (Signed)
Patient notified of results & recommendations. Expressed understanding.

## 2019-12-05 ENCOUNTER — Telehealth: Payer: Self-pay | Admitting: Internal Medicine

## 2019-12-05 NOTE — Telephone Encounter (Signed)
[facility] [address] [facility]

## 2019-12-06 NOTE — Telephone Encounter (Signed)
[facility], go to the pharmacy & ask for the vaccine to be administered.

## 2020-01-18 ENCOUNTER — Other Ambulatory Visit: Payer: Self-pay

## 2020-01-18 ENCOUNTER — Ambulatory Visit (INDEPENDENT_AMBULATORY_CARE_PROVIDER_SITE_OTHER): Payer: Medicare Other

## 2020-01-18 ENCOUNTER — Ambulatory Visit (INDEPENDENT_AMBULATORY_CARE_PROVIDER_SITE_OTHER): Payer: Medicare Other | Admitting: Internal Medicine

## 2020-01-18 ENCOUNTER — Encounter: Payer: Self-pay | Admitting: Internal Medicine

## 2020-01-18 VITALS — BP 127/89 | HR 76 | Temp 97.3°F | Resp 17 | Ht 69.5 in | Wt 268.0 lb

## 2020-01-18 DIAGNOSIS — Z130 Encounter for screening for diseases of the blood and blood-forming organs and certain disorders involving the immune mechanism: Secondary | ICD-10-CM

## 2020-01-18 DIAGNOSIS — M1712 Unilateral primary osteoarthritis, left knee: Secondary | ICD-10-CM | POA: Diagnosis not present

## 2020-01-18 DIAGNOSIS — Z13220 Encounter for screening for lipoid disorders: Secondary | ICD-10-CM | POA: Diagnosis not present

## 2020-01-18 DIAGNOSIS — Z23 Encounter for immunization: Secondary | ICD-10-CM | POA: Diagnosis not present

## 2020-01-18 DIAGNOSIS — Z0000 Encounter for general adult medical examination without abnormal findings: Secondary | ICD-10-CM | POA: Diagnosis not present

## 2020-01-18 DIAGNOSIS — Z1211 Encounter for screening for malignant neoplasm of colon: Secondary | ICD-10-CM

## 2020-01-18 DIAGNOSIS — Z13228 Encounter for screening for other metabolic disorders: Secondary | ICD-10-CM | POA: Diagnosis not present

## 2020-01-18 DIAGNOSIS — M25552 Pain in left hip: Secondary | ICD-10-CM | POA: Diagnosis not present

## 2020-01-18 DIAGNOSIS — M25551 Pain in right hip: Secondary | ICD-10-CM

## 2020-01-18 DIAGNOSIS — M160 Bilateral primary osteoarthritis of hip: Secondary | ICD-10-CM | POA: Diagnosis not present

## 2020-01-18 MED ORDER — SILDENAFIL CITRATE 100 MG PO TABS: 100.0000 mg | ORAL_TABLET | Freq: Every day | ORAL | 0 refills | Status: DC | PRN

## 2020-01-18 NOTE — Progress Notes (Signed)
Subjective:    Johnny Brooks - 64 y.o. male MRN [id number]  Date of birth: 1956-03-02  HPI  Johnny Brooks is here for annual exam.  Patient complains of bilateral hip pain since having COVID in July 2020. Has a history of chronic sciatica, left sided which now seems to be gone. His friend who gave him COVID has also complained of the same. Worst when sitting and driving for prolonged periods of time. Hurts deep within the joints. No radiating pain. History of bilateral total knee replacements. No history of hip surgery. Moving around seems to improve the hip pain. Does not impact day to day living. Seems worse when driving car than when driving the truck.    Health Maintenance:  Health Maintenance Due  Topic Date Due  . COVID-19 Vaccine (1) Never done  . TETANUS/TDAP  Never done  . COLONOSCOPY  Never done  . PNA vac Low Risk Adult (1 of 2 - PCV13) Never done  . INFLUENZA VACCINE  Never done    -  reports that he has quit smoking. His smoking use included cigars. He has never used smokeless tobacco. - Review of Systems: Per HPI. - Past Medical History: Patient Active Problem List   Diagnosis Date Noted  . Allergic rhinitis, cause unspecified 05/11/2013  . Arthritis of knee 02/26/2013  . Left bundle branch block 02/20/2013   - Medications: reviewed and updated   Objective:   Physical Exam BP 127/89   Pulse 76   Temp (!) 97.3 F (36.3 C) (Temporal)   Resp 17   Ht 5' 9.5" (1.765 m)   Wt 268 lb (121.6 kg)   SpO2 96%   BMI 39.01 kg/m  Physical Exam Constitutional:      Appearance: He is not diaphoretic.  HENT:     Head: Normocephalic and atraumatic.  Eyes:     Conjunctiva/sclera: Conjunctivae normal.     Pupils: Pupils are equal, round, and reactive to light.  Neck:     Thyroid: No thyromegaly.  Cardiovascular:     Rate and Rhythm: Normal rate and regular rhythm.     Heart sounds: Normal heart sounds. No murmur heard.   Pulmonary:     Effort: Pulmonary effort  is normal. No respiratory distress.     Breath sounds: Normal breath sounds. No wheezing.  Abdominal:     General: Bowel sounds are normal. There is no distension.     Palpations: Abdomen is soft.     Tenderness: There is no abdominal tenderness. There is no guarding or rebound.  Musculoskeletal:        General: No deformity. Normal range of motion.     Cervical back: Normal range of motion and neck supple.     Comments: No TTP over the lateral hips. Full ROM. Negative FABER test bilaterally. Strength preserved.   Lymphadenopathy:     Cervical: No cervical adenopathy.  Skin:    General: Skin is warm and dry.     Findings: No rash.  Neurological:     Mental Status: He is alert and oriented to person, place, and time.     Gait: Gait is intact.  Psychiatric:        Mood and Affect: Mood and affect normal.        Judgment: Judgment normal.        Assessment & Plan:   1. Annual physical exam Counseled on 150 minutes of exercise per week, healthy eating (including decreased daily intake of saturated  fats, cholesterol, added sugars, sodium), STI prevention, routine healthcare maintenance. - CBC with Differential - Lipid Panel - Comprehensive metabolic panel  2. Screening for deficiency anemia - CBC with Differential  3. Screening for metabolic disorder - Comprehensive metabolic panel  4. Colon cancer screening - Ambulatory referral to Gastroenterology  5. Lipid screening - Lipid Panel  6. Needs flu shot - Flu Vaccine QUAD 6+ mos PF IM (Fluarix Quad PF)  7. Bilateral hip pain Exam is benign. Suspect may be related to some muscle tension from prolonged periods of sitting. However, will obtain x-rays given age and chronic pain >1 year.  - DG Hip Unilat W OR W/O Pelvis 2-3 Views Right; Future - DG Hip Unilat W OR W/O Pelvis 2-3 Views Left; Future  8. Need for pneumococcal vaccine - Pneumococcal conjugate vaccine 13-valent    Marcy Siren, D.O. 01/18/2020, 8:46  AM Primary Care at [facility]

## 2020-01-19 LAB — CBC WITH DIFFERENTIAL/PLATELET
Basophils Absolute: 0 10*3/uL (ref 0.0–0.2)
Basos: 1 %
EOS (ABSOLUTE): 0.1 10*3/uL (ref 0.0–0.4)
Eos: 2 %
Hematocrit: 44.8 % (ref 37.5–51.0)
Hemoglobin: 15.6 g/dL (ref 13.0–17.7)
Immature Grans (Abs): 0 10*3/uL (ref 0.0–0.1)
Immature Granulocytes: 0 %
Lymphocytes Absolute: 2.2 10*3/uL (ref 0.7–3.1)
Lymphs: 36 %
MCH: 34.1 pg — ABNORMAL HIGH (ref 26.6–33.0)
MCHC: 34.8 g/dL (ref 31.5–35.7)
MCV: 98 fL — ABNORMAL HIGH (ref 79–97)
Monocytes Absolute: 0.6 10*3/uL (ref 0.1–0.9)
Monocytes: 10 %
Neutrophils Absolute: 3.1 10*3/uL (ref 1.4–7.0)
Neutrophils: 51 %
Platelets: 225 10*3/uL (ref 150–450)
RBC: 4.58 x10E6/uL (ref 4.14–5.80)
RDW: 12.2 % (ref 11.6–15.4)
WBC: 6.1 10*3/uL (ref 3.4–10.8)

## 2020-01-19 LAB — COMPREHENSIVE METABOLIC PANEL
ALT: 38 IU/L (ref 0–44)
AST: 25 IU/L (ref 0–40)
Albumin/Globulin Ratio: 1.8 (ref 1.2–2.2)
Albumin: 4.6 g/dL (ref 3.8–4.8)
Alkaline Phosphatase: 51 IU/L (ref 44–121)
BUN/Creatinine Ratio: 8 — ABNORMAL LOW (ref 10–24)
BUN: 10 mg/dL (ref 8–27)
Bilirubin Total: 0.6 mg/dL (ref 0.0–1.2)
CO2: 26 mmol/L (ref 20–29)
Calcium: 9.4 mg/dL (ref 8.6–10.2)
Chloride: 101 mmol/L (ref 96–106)
Creatinine, Ser: 1.26 mg/dL (ref 0.76–1.27)
GFR calc Af Amer: 69 mL/min/{1.73_m2} (ref >59)
GFR calc non Af Amer: 59 mL/min/{1.73_m2} — ABNORMAL LOW (ref >59)
Globulin, Total: 2.6 g/dL (ref 1.5–4.5)
Glucose: 100 mg/dL — ABNORMAL HIGH (ref 65–99)
Potassium: 4.8 mmol/L (ref 3.5–5.2)
Sodium: 140 mmol/L (ref 134–144)
Total Protein: 7.2 g/dL (ref 6.0–8.5)

## 2020-01-19 LAB — LIPID PANEL
Chol/HDL Ratio: 2.5 ratio (ref 0.0–5.0)
Cholesterol, Total: 137 mg/dL (ref 100–199)
HDL: 54 mg/dL (ref >39)
LDL Chol Calc (NIH): 68 mg/dL (ref 0–99)
Triglycerides: 75 mg/dL (ref 0–149)
VLDL Cholesterol Cal: 15 mg/dL (ref 5–40)

## 2020-01-24 NOTE — Progress Notes (Signed)
Normal lab letter mailed.

## 2020-01-25 ENCOUNTER — Telehealth: Payer: Self-pay | Admitting: Internal Medicine

## 2020-01-25 NOTE — Telephone Encounter (Signed)
Pt returned call for lab results. Pt ph [phone].

## 2020-01-25 NOTE — Telephone Encounter (Signed)
Pt name and DOB verified. Patient aware of results and result note from blood work and imaging. Patient does not wish to precede with pain medication or Ortho specialist at this time.

## 2020-02-05 DIAGNOSIS — Z20822 Contact with and (suspected) exposure to covid-19: Secondary | ICD-10-CM | POA: Diagnosis not present

## 2020-02-18 ENCOUNTER — Telehealth: Payer: Self-pay

## 2020-02-18 NOTE — Telephone Encounter (Signed)
Pt wants to talk about x ray results

## 2020-02-18 NOTE — Telephone Encounter (Signed)
Spoke with patient & he would like xray reports mailed to him. He states that he already has an Orthopedic provider that he's comfortable with since he's had knee replacements in the past.

## 2020-11-22 ENCOUNTER — Telehealth: Payer: Self-pay | Admitting: Internal Medicine

## 2020-11-22 NOTE — Telephone Encounter (Signed)
Attempted to call patient to schedule Medicare Wellness. Left msg for patient. Unable to send mychart msg as patient is not set up for mychart. AS, CMA

## 2020-12-14 ENCOUNTER — Telehealth: Payer: Self-pay

## 2020-12-14 NOTE — Telephone Encounter (Signed)
I called to schedule AWV. Lvm for the pt to call the office back.

## 2021-01-20 ENCOUNTER — Ambulatory Visit (INDEPENDENT_AMBULATORY_CARE_PROVIDER_SITE_OTHER): Payer: Medicare Other | Admitting: Family Medicine

## 2021-01-20 ENCOUNTER — Encounter: Payer: Self-pay | Admitting: Family Medicine

## 2021-01-20 ENCOUNTER — Other Ambulatory Visit: Payer: Self-pay

## 2021-01-20 VITALS — BP 127/84 | HR 91 | Resp 16 | Wt 264.6 lb

## 2021-01-20 DIAGNOSIS — L989 Disorder of the skin and subcutaneous tissue, unspecified: Secondary | ICD-10-CM | POA: Diagnosis not present

## 2021-01-20 DIAGNOSIS — N529 Male erectile dysfunction, unspecified: Secondary | ICD-10-CM | POA: Diagnosis not present

## 2021-01-20 DIAGNOSIS — Z23 Encounter for immunization: Secondary | ICD-10-CM | POA: Diagnosis not present

## 2021-01-20 MED ORDER — SILDENAFIL CITRATE 100 MG PO TABS: 100.0000 mg | ORAL_TABLET | Freq: Every day | ORAL | 3 refills | Status: DC | PRN

## 2021-01-21 NOTE — Progress Notes (Signed)
Established Patient Office Visit  Subjective:  Patient ID: Johnny Brooks, male    DOB: Mar 02, 1956  Age: 65 y.o. MRN: [id number]  CC:  Chief Complaint  Patient presents with   Follow-up    HPI SWANSON FARNELL presents for follow-up of erectile dysfunction as well as complaint of skin lesion that he believes warrants further evaluation.  Past Medical History:  Diagnosis Date   Arthritis    knees   Left bundle branch block     Past Surgical History:  Procedure Laterality Date   ANKLE SURGERY     right   arthroscopies     numerous on knees   dislocated clavicle     right   KNEE SURGERY     left  1973   TOTAL KNEE ARTHROPLASTY Bilateral 02/26/2013   Procedure: TOTAL KNEE BILATERAL;  Surgeon: Nestor Lewandowsky, MD;  Location: [facility] OR;  Service: Orthopedics;  Laterality: Bilateral;    History reviewed. No pertinent family history.  Social History   Socioeconomic History   Marital status: Widowed    Spouse name: Not on file   Number of children: Not on file   Years of education: Not on file   Highest education level: Not on file  Occupational History   Not on file  Tobacco Use   Smoking status: Former    Types: Cigars   Smokeless tobacco: Never  Substance and Sexual Activity   Alcohol use: Yes    Alcohol/week: 3.0 standard drinks    Types: 3 Cans of beer per week   Drug use: No   Sexual activity: Not on file  Other Topics Concern   Not on file  Social History Narrative   Not on file   Social Determinants of Health   Financial Resource Strain: Not on file  Food Insecurity: Not on file  Transportation Needs: Not on file  Physical Activity: Not on file  Stress: Not on file  Social Connections: Not on file  Intimate Partner Violence: Not on file    ROS Review of Systems  Objective:   Today's Vitals: BP 127/84 (BP Location: Right Arm, Patient Position: Sitting, Cuff Size: Large)   Pulse 91   Resp 16   Wt 264 lb 9.6 oz (120 kg)   SpO2 93%   BMI 38.51  kg/m   Physical Exam Vitals and nursing note reviewed.  Constitutional:      General: He is not in acute distress. Cardiovascular:     Rate and Rhythm: Normal rate and regular rhythm.  Pulmonary:     Effort: Pulmonary effort is normal.     Breath sounds: Normal breath sounds.  Skin:    Findings: Lesion present.  Neurological:     General: No focal deficit present.     Mental Status: He is alert and oriented to person, place, and time.    Assessment & Plan:   1. Erectile dysfunction, unspecified erectile dysfunction type Appears to be doing well with present management.  Meds were refilled. - sildenafil (VIAGRA) 100 MG tablet; Take 1 tablet (100 mg total) by mouth daily as needed for erectile dysfunction.  Dispense: 12 tablet; Refill: 3  2. Skin lesion Referral to dermatology for further eval and management. - Ambulatory referral to Dermatology    Outpatient Encounter Medications as of 01/20/2021  Medication Sig   [DISCONTINUED] diphenhydrAMINE (BENADRYL) 25 mg capsule Take 25 mg by mouth daily as needed for itching or allergies.   [DISCONTINUED] sildenafil (VIAGRA) 100 MG  tablet Take 1 tablet (100 mg total) by mouth daily as needed for erectile dysfunction.   sildenafil (VIAGRA) 100 MG tablet Take 1 tablet (100 mg total) by mouth daily as needed for erectile dysfunction.   No facility-administered encounter medications on file as of 01/20/2021.    Follow-up: Return in about 6 months (around 07/21/2021) for physical.   Tommie Raymond, MD

## 2021-05-28 ENCOUNTER — Telehealth: Payer: Self-pay | Admitting: Family Medicine

## 2021-05-28 NOTE — Telephone Encounter (Signed)
pt states he scheduled a physical appt today but he noticed an overdue for vaccines msg per my chart notification: for tetanus and pneunomia and wants to know if needed to be done before physical or can be done the same day in April.  ?

## 2021-05-29 ENCOUNTER — Encounter: Payer: Self-pay | Admitting: *Deleted

## 2021-06-02 ENCOUNTER — Telehealth: Payer: Self-pay | Admitting: Family Medicine

## 2021-06-02 NOTE — Telephone Encounter (Signed)
sildenafil (VIAGRA) 100 MG tablet [id number]  ? ?[facility]-  RX  MAIL SERVICE   ?  ?SET UP FOR THE YEAR  For 90 days  ?

## 2021-06-03 ENCOUNTER — Other Ambulatory Visit: Payer: Self-pay | Admitting: Family Medicine

## 2021-06-03 DIAGNOSIS — N529 Male erectile dysfunction, unspecified: Secondary | ICD-10-CM

## 2021-06-03 MED ORDER — SILDENAFIL CITRATE 100 MG PO TABS: 100.0000 mg | ORAL_TABLET | Freq: Every day | ORAL | 3 refills | Status: DC | PRN

## 2021-07-14 ENCOUNTER — Encounter: Payer: Self-pay | Admitting: Family Medicine

## 2021-07-14 ENCOUNTER — Ambulatory Visit (INDEPENDENT_AMBULATORY_CARE_PROVIDER_SITE_OTHER): Payer: Medicare Other | Admitting: Family Medicine

## 2021-07-14 VITALS — BP 134/87 | HR 80 | Temp 98.0°F | Resp 16 | Ht 72.0 in | Wt 275.6 lb

## 2021-07-14 DIAGNOSIS — Z1211 Encounter for screening for malignant neoplasm of colon: Secondary | ICD-10-CM | POA: Diagnosis not present

## 2021-07-14 DIAGNOSIS — Z130 Encounter for screening for diseases of the blood and blood-forming organs and certain disorders involving the immune mechanism: Secondary | ICD-10-CM

## 2021-07-14 DIAGNOSIS — Z0000 Encounter for general adult medical examination without abnormal findings: Secondary | ICD-10-CM | POA: Diagnosis not present

## 2021-07-14 DIAGNOSIS — Z1329 Encounter for screening for other suspected endocrine disorder: Secondary | ICD-10-CM

## 2021-07-14 DIAGNOSIS — Z13228 Encounter for screening for other metabolic disorders: Secondary | ICD-10-CM

## 2021-07-14 DIAGNOSIS — Z13220 Encounter for screening for lipoid disorders: Secondary | ICD-10-CM

## 2021-07-14 DIAGNOSIS — Z125 Encounter for screening for malignant neoplasm of prostate: Secondary | ICD-10-CM

## 2021-07-14 NOTE — Progress Notes (Signed)
Patient is here for CPE and lab work. Patient said he sometimes get a little cold in his legs while sitting. He would like to talk with provider about that issue. ? ?Patient has no other concerns today ? ?

## 2021-07-15 LAB — CBC WITH DIFFERENTIAL/PLATELET
Basophils Absolute: 0.1 10*3/uL (ref 0.0–0.2)
Basos: 1 %
EOS (ABSOLUTE): 0.1 10*3/uL (ref 0.0–0.4)
Eos: 2 %
Hematocrit: 43.5 % (ref 37.5–51.0)
Hemoglobin: 15.2 g/dL (ref 13.0–17.7)
Immature Grans (Abs): 0 10*3/uL (ref 0.0–0.1)
Immature Granulocytes: 0 %
Lymphocytes Absolute: 1.6 10*3/uL (ref 0.7–3.1)
Lymphs: 31 %
MCH: 33.9 pg — ABNORMAL HIGH (ref 26.6–33.0)
MCHC: 34.9 g/dL (ref 31.5–35.7)
MCV: 97 fL (ref 79–97)
Monocytes Absolute: 0.4 10*3/uL (ref 0.1–0.9)
Monocytes: 8 %
Neutrophils Absolute: 3.1 10*3/uL (ref 1.4–7.0)
Neutrophils: 58 %
Platelets: 201 10*3/uL (ref 150–450)
RBC: 4.48 x10E6/uL (ref 4.14–5.80)
RDW: 12.4 % (ref 11.6–15.4)
WBC: 5.3 10*3/uL (ref 3.4–10.8)

## 2021-07-15 LAB — CMP14+EGFR
ALT: 32 IU/L (ref 0–44)
AST: 20 IU/L (ref 0–40)
Albumin/Globulin Ratio: 1.9 (ref 1.2–2.2)
Albumin: 4.4 g/dL (ref 3.8–4.8)
Alkaline Phosphatase: 54 IU/L (ref 44–121)
BUN/Creatinine Ratio: 10 (ref 10–24)
BUN: 11 mg/dL (ref 8–27)
Bilirubin Total: 0.5 mg/dL (ref 0.0–1.2)
CO2: 22 mmol/L (ref 20–29)
Calcium: 9.3 mg/dL (ref 8.6–10.2)
Chloride: 104 mmol/L (ref 96–106)
Creatinine, Ser: 1.11 mg/dL (ref 0.76–1.27)
Globulin, Total: 2.3 g/dL (ref 1.5–4.5)
Glucose: 123 mg/dL — ABNORMAL HIGH (ref 70–99)
Potassium: 4.2 mmol/L (ref 3.5–5.2)
Sodium: 141 mmol/L (ref 134–144)
Total Protein: 6.7 g/dL (ref 6.0–8.5)
eGFR: 73 mL/min/{1.73_m2} (ref >59)

## 2021-07-15 LAB — PSA: Prostate Specific Ag, Serum: 0.5 ng/mL (ref 0.0–4.0)

## 2021-07-15 LAB — LIPID PANEL
Chol/HDL Ratio: 2.8 ratio (ref 0.0–5.0)
Cholesterol, Total: 130 mg/dL (ref 100–199)
HDL: 46 mg/dL
LDL Chol Calc (NIH): 66 mg/dL (ref 0–99)
Triglycerides: 93 mg/dL (ref 0–149)
VLDL Cholesterol Cal: 18 mg/dL (ref 5–40)

## 2021-07-15 LAB — HEMOGLOBIN A1C
Est. average glucose Bld gHb Est-mCnc: 114 mg/dL
Hgb A1c MFr Bld: 5.6 % (ref 4.8–5.6)

## 2021-07-16 ENCOUNTER — Encounter: Payer: Self-pay | Admitting: Family Medicine

## 2021-07-16 NOTE — Progress Notes (Signed)
? ?Established Patient Office Visit ? ?Subjective   ? ?Patient ID: Johnny Brooks, male    DOB: November 08, 1955  Age: 66 y.o. MRN: [id number] ? ?CC:  ?Chief Complaint  ?Patient presents with  ? Annual Exam  ? ? ?HPI ?Johnny Brooks presents for routine annual exam. Patient denies acute complaints or concerns.  ? ? ?Outpatient Encounter Medications as of 07/14/2021  ?Medication Sig  ? sildenafil (VIAGRA) 100 MG tablet Take 1 tablet (100 mg total) by mouth daily as needed for erectile dysfunction.  ? ?No facility-administered encounter medications on file as of 07/14/2021.  ? ? ?Past Medical History:  ?Diagnosis Date  ? Arthritis   ? knees  ? Left bundle branch block   ? ? ?Past Surgical History:  ?Procedure Laterality Date  ? ANKLE SURGERY    ? right  ? arthroscopies    ? numerous on knees  ? dislocated clavicle    ? right  ? KNEE SURGERY    ? left  1973  ? TOTAL KNEE ARTHROPLASTY Bilateral 02/26/2013  ? Procedure: TOTAL KNEE BILATERAL;  Surgeon: Kerin Salen, MD;  Location: [place];  Service: Orthopedics;  Laterality: Bilateral;  ? ? ?No family history on file. ? ?Social History  ? ?Socioeconomic History  ? Marital status: Widowed  ?  Spouse name: Not on file  ? Number of children: Not on file  ? Years of education: Not on file  ? Highest education level: Not on file  ?Occupational History  ? Not on file  ?Tobacco Use  ? Smoking status: Former  ?  Types: Cigars  ? Smokeless tobacco: Never  ?Substance and Sexual Activity  ? Alcohol use: Yes  ?  Alcohol/week: 3.0 standard drinks  ?  Types: 3 Cans of beer per week  ? Drug use: No  ? Sexual activity: Not on file  ?Other Topics Concern  ? Not on file  ?Social History Narrative  ? Not on file  ? ?Social Determinants of Health  ? ?Financial Resource Strain: Not on file  ?Food Insecurity: Not on file  ?Transportation Needs: Not on file  ?Physical Activity: Not on file  ?Stress: Not on file  ?Social Connections: Not on file  ?Intimate Partner Violence: Not on file  ? ? ?Review of  Systems  ?All other systems reviewed and are negative. ? ?  ? ? ?Objective   ? ?BP 134/87   Pulse 80   Temp 98 ?F (36.7 ?C) (Oral)   Resp 16   Ht 6' (1.829 m)   Wt 275 lb 9.6 oz (125 kg)   BMI 37.38 kg/m?  ? ?Physical Exam ?Vitals and nursing note reviewed.  ?Constitutional:   ?   General: He is not in acute distress. ?HENT:  ?   Head: Normocephalic and atraumatic.  ?   Right Ear: Tympanic membrane, ear canal and external ear normal.  ?   Left Ear: Tympanic membrane, ear canal and external ear normal.  ?   Nose: Nose normal.  ?   Mouth/Throat:  ?   Mouth: Mucous membranes are moist.  ?   Pharynx: Oropharynx is clear.  ?Eyes:  ?   Conjunctiva/sclera: Conjunctivae normal.  ?   Pupils: Pupils are equal, round, and reactive to light.  ?Neck:  ?   Thyroid: No thyromegaly.  ?Cardiovascular:  ?   Rate and Rhythm: Normal rate and regular rhythm.  ?   Heart sounds: Normal heart sounds. No murmur heard. ?Pulmonary:  ?  Effort: Pulmonary effort is normal.  ?   Breath sounds: Normal breath sounds.  ?Abdominal:  ?   General: There is no distension.  ?   Palpations: Abdomen is soft. There is no mass.  ?   Tenderness: There is no abdominal tenderness.  ?   Hernia: There is no hernia in the left inguinal area or right inguinal area.  ?Genitourinary: ?   Penis: Normal and circumcised.   ?   Testes: Normal.  ?Musculoskeletal:     ?   General: Normal range of motion.  ?   Cervical back: Normal range of motion and neck supple.  ?   Right lower leg: No edema.  ?   Left lower leg: No edema.  ?Skin: ?   General: Skin is warm and dry.  ?Neurological:  ?   General: No focal deficit present.  ?   Mental Status: He is alert and oriented to person, place, and time. Mental status is at baseline.  ?Psychiatric:     ?   Mood and Affect: Mood normal.     ?   Behavior: Behavior normal.  ? ? ? ?  ? ?Assessment & Plan:  ? ?1. Annual physical exam ?Routine labs ordered.  ?- CMP14+EGFR ? ?2. Screening for deficiency anemia ? ?- CBC with  Differential ? ?3. Screening for colon cancer ? ? ?4. Screening for endocrine/metabolic/immunity disorders ? ?- Hemoglobin A1c ? ?5. Screening for prostate cancer ? ?- PSA ? ?6. Screening for lipid disorders ? ?- Lipid Panel ? ? ?No follow-ups on file.  ? ?Becky Sax, MD ? ? ?

## 2021-07-28 ENCOUNTER — Ambulatory Visit: Payer: Medicare Other | Admitting: Physician Assistant

## 2021-09-23 ENCOUNTER — Encounter: Payer: Self-pay | Admitting: Family Medicine

## 2021-09-23 ENCOUNTER — Ambulatory Visit (INDEPENDENT_AMBULATORY_CARE_PROVIDER_SITE_OTHER): Payer: Medicare Other | Admitting: Family Medicine

## 2021-09-23 VITALS — BP 126/89 | HR 93 | Temp 98.1°F | Resp 16 | Wt 273.8 lb

## 2021-09-23 DIAGNOSIS — K219 Gastro-esophageal reflux disease without esophagitis: Secondary | ICD-10-CM

## 2021-09-23 DIAGNOSIS — N529 Male erectile dysfunction, unspecified: Secondary | ICD-10-CM

## 2021-09-23 MED ORDER — SILDENAFIL CITRATE 100 MG PO TABS: 100.0000 mg | ORAL_TABLET | Freq: Every day | ORAL | 3 refills | Status: DC | PRN

## 2021-09-23 MED ORDER — OMEPRAZOLE 40 MG PO CPDR: 40.0000 mg | DELAYED_RELEASE_CAPSULE | Freq: Every day | ORAL | 3 refills | Status: DC

## 2021-09-23 NOTE — Progress Notes (Unsigned)
Patient is her for medication refill Patient has no other concerns today 

## 2021-09-24 ENCOUNTER — Other Ambulatory Visit: Payer: Self-pay

## 2021-09-24 ENCOUNTER — Encounter: Payer: Self-pay | Admitting: Family Medicine

## 2021-09-24 NOTE — Progress Notes (Signed)
Established Patient Office Visit  Subjective    Patient ID: Johnny Brooks, male    DOB: 1955-06-08  Age: 66 y.o. MRN: [id number]  CC:  Chief Complaint  Patient presents with   Medication Refill    HPI GERMANY CHELF presents for routine follow up of chronic med issues. Denies acute complaints or concerns.    Outpatient Encounter Medications as of 09/23/2021  Medication Sig   CLARITIN 10 MG tablet    omeprazole (PRILOSEC) 40 MG capsule Take 1 capsule (40 mg total) by mouth daily.   ZYRTEC ALLERGY 10 MG tablet    [DISCONTINUED] sildenafil (VIAGRA) 100 MG tablet Take 1 tablet (100 mg total) by mouth daily as needed for erectile dysfunction.   sildenafil (VIAGRA) 100 MG tablet Take 1 tablet (100 mg total) by mouth daily as needed for erectile dysfunction.   No facility-administered encounter medications on file as of 09/23/2021.    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  Social History   Socioeconomic History   Marital status: Widowed    Spouse name: Not on file   Number of children: Not on file   Years of education: Not on file   Highest education level: Not on file  Occupational History   Not on file  Tobacco Use   Smoking status: Former    Types: Cigars   Smokeless tobacco: Never  Substance and Sexual Activity   Alcohol use: Yes    Alcohol/week: 3.0 standard drinks of alcohol    Types: 3 Cans of beer per week   Drug use: No   Sexual activity: Not on file  Other Topics Concern   Not on file  Social History Narrative    Not on file   Social Determinants of Health   Financial Resource Strain: Not on file  Food Insecurity: Not on file  Transportation Needs: Not on file  Physical Activity: Not on file  Stress: Not on file  Social Connections: Not on file  Intimate Partner Violence: Not on file    Review of Systems  All other systems reviewed and are negative.       Objective    BP 126/89   Pulse 93   Temp 98.1 F (36.7 C) (Oral)   Resp 16   Wt 273 lb 12.8 oz (124.2 kg)   SpO2 94%   BMI 37.13 kg/m   Physical Exam Vitals and nursing note reviewed.  Constitutional:      General: He is not in acute distress. Cardiovascular:     Rate and Rhythm: Normal rate and regular rhythm.  Pulmonary:     Effort: Pulmonary effort is normal.     Breath sounds: Normal breath sounds.  Abdominal:     Palpations: Abdomen is soft.     Tenderness: There is no abdominal tenderness.  Neurological:     General: No focal deficit present.     Mental Status:  He is alert and oriented to person, place, and time.         Assessment & Plan:   1. Gastroesophageal reflux disease without esophagitis Improved sx. Continue present management. Meds refilled.   2. Erectile dysfunction, unspecified erectile dysfunction type Meds refilled.  - sildenafil (VIAGRA) 100 MG tablet; Take 1 tablet (100 mg total) by mouth daily as needed for erectile dysfunction.  Dispense: 30 tablet; Refill: 3    No follow-ups on file.   Tommie Raymond, MD

## 2021-10-01 ENCOUNTER — Ambulatory Visit: Payer: Medicare Other | Admitting: Family Medicine

## 2021-10-26 ENCOUNTER — Ambulatory Visit: Payer: Self-pay | Admitting: *Deleted

## 2021-10-26 ENCOUNTER — Emergency Department (HOSPITAL_COMMUNITY): Payer: Medicare Other

## 2021-10-26 ENCOUNTER — Emergency Department (HOSPITAL_COMMUNITY)
Admission: EM | Admit: 2021-10-26 | Discharge: 2021-10-26 | Disposition: A | Discharge status: Home / Self Care | Payer: Medicare Other | Attending: Emergency Medicine | Admitting: Emergency Medicine

## 2021-10-26 ENCOUNTER — Encounter (HOSPITAL_COMMUNITY): Payer: Self-pay

## 2021-10-26 ENCOUNTER — Other Ambulatory Visit: Payer: Self-pay

## 2021-10-26 DIAGNOSIS — R110 Nausea: Secondary | ICD-10-CM | POA: Diagnosis not present

## 2021-10-26 DIAGNOSIS — I10 Essential (primary) hypertension: Secondary | ICD-10-CM | POA: Diagnosis not present

## 2021-10-26 DIAGNOSIS — R030 Elevated blood-pressure reading, without diagnosis of hypertension: Secondary | ICD-10-CM

## 2021-10-26 DIAGNOSIS — R519 Headache, unspecified: Secondary | ICD-10-CM

## 2021-10-26 LAB — BASIC METABOLIC PANEL
Anion gap: 10 (ref 5–15)
BUN: 10 mg/dL (ref 8–23)
CO2: 23 mmol/L (ref 22–32)
Calcium: 9.2 mg/dL (ref 8.9–10.3)
Chloride: 106 mmol/L (ref 98–111)
Creatinine, Ser: 1.11 mg/dL (ref 0.61–1.24)
GFR, Estimated: >60 mL/min (ref >60)
Glucose, Bld: 92 mg/dL (ref 70–99)
Potassium: 4.5 mmol/L (ref 3.5–5.1)
Sodium: 139 mmol/L (ref 135–145)

## 2021-10-26 LAB — CBC WITH DIFFERENTIAL/PLATELET
Abs Immature Granulocytes: 0.01 10*3/uL (ref 0.00–0.07)
Basophils Absolute: 0 10*3/uL (ref 0.0–0.1)
Basophils Relative: 1 %
Eosinophils Absolute: 0.1 10*3/uL (ref 0.0–0.5)
Eosinophils Relative: 2 %
HCT: 47.2 % (ref 39.0–52.0)
Hemoglobin: 16.3 g/dL (ref 13.0–17.0)
Immature Granulocytes: 0 %
Lymphocytes Relative: 37 %
Lymphs Abs: 2 10*3/uL (ref 0.7–4.0)
MCH: 34 pg (ref 26.0–34.0)
MCHC: 34.5 g/dL (ref 30.0–36.0)
MCV: 98.3 fL (ref 80.0–100.0)
Monocytes Absolute: 0.6 10*3/uL (ref 0.1–1.0)
Monocytes Relative: 11 %
Neutro Abs: 2.7 10*3/uL (ref 1.7–7.7)
Neutrophils Relative %: 49 %
Platelets: 220 10*3/uL (ref 150–400)
RBC: 4.8 MIL/uL (ref 4.22–5.81)
RDW: 12.4 % (ref 11.5–15.5)
WBC: 5.4 10*3/uL (ref 4.0–10.5)
nRBC: 0 % (ref 0.0–0.2)

## 2021-10-26 NOTE — Discharge Instructions (Signed)
BP 138/89   Pulse 78   Temp 98.9 F (37.2 C) (Oral)   Resp 18   Ht 6' (1.829 m)   Wt 124.7 kg   SpO2 94%   BMI 37.30 kg/m  Your vital signs at discharge are as above.  I suspect that your blood pressure machine was poorly calibrated.  Your blood pressure is somewhat elevated but not emergently so.  The remainder of your examination including your blood work and CT imaging were all negative for acute abnormality.  If you continue to have bad headaches please follow closely with your primary care physician.  You are having a headache. No specific cause was found today for your headache. It may have been a migraine or other cause of headache. Stress, anxiety, fatigue, and depression are common triggers for headaches. Your headache today does not appear to be life-threatening or require hospitalization, but often the exact cause of headaches is not determined in the emergency department. Therefore, follow-up with your doctor is very important to find out what may have caused your headache, and whether or not you need any further diagnostic testing or treatment. Sometimes headaches can appear benign (not harmful), but then more serious symptoms can develop which should prompt an immediate re-evaluation by your doctor or the emergency department. SEEK MEDICAL ATTENTION IF: You develop possible problems with medications prescribed.  The medications don't resolve your headache, if it recurs , or if you have multiple episodes of vomiting or can't take fluids. You have a change from the usual headache. RETURN IMMEDIATELY IF you develop a sudden, severe headache or confusion, become poorly responsive or faint, develop a fever above 100.98F or problem breathing, have a change in speech, vision, swallowing, or understanding, or develop new weakness, numbness, tingling, incoordination, or have a seizure.

## 2021-10-26 NOTE — ED Triage Notes (Signed)
Patient reports that he had a migraine over the past weekend, with nausea. Patient states he took his BP at home and it was 250/150 and today he called his PCP today and was told to come to the ED for further evaluation. BP in triage-137/97.

## 2021-10-26 NOTE — Telephone Encounter (Signed)
   Chief Complaint: HTN Symptoms: 250/148, 202/124, wrist cuff Frequency: two days in a row, with headache  Pertinent Negatives: Patient denies neurological symptoms Disposition: [x] ED /[x] Urgent Care (no appt availability in office) / [] Appointment(In office/virtual)/ []  [facility]/ [] Home Care/ [] Refused Recommended Disposition /[] [facility] [facility]/ []  Follow-up with PCP Additional Notes: We discussed both ED/UC. No neurological symptoms and wrist cuff usually not accurate. Pt debating which one he will go to but states he will go. I told him it should be soon, within 4 hrs at the most.  Reason for Disposition  [1] Systolic BP  >= 200 OR Diastolic >= 120 AND [2] having NO cardiac or neurologic symptoms  Answer Assessment - Initial Assessment Questions 1. BLOOD PRESSURE: "What is the blood pressure?" "Did you take at least two measurements 5 minutes apart?"     250/148, 202/124 yesterday and today 2. ONSET: "When did you take your blood pressure?yesterday and today 3. HOW: "How did you take your blood pressure?" (e.g., automatic home BP monitor, visiting nurse)     Home cuff, wrist 4. HISTORY: "Do you have a history of high blood pressure?"     no 5. MEDICINES: "Are you taking any medicines for blood pressure?" "Have you missed any doses recently?"     no 6. OTHER SYMPTOMS: "Do you have any symptoms?" (e.g., blurred vision, chest pain, difficulty breathing, headache, weakness)     Blurred vision, headache, lethargic 7. PREGNANCY: "Is there any chance you are pregnant?" "When was your last menstrual period?"     Na  Protocols used: Blood Pressure - High-A-AH

## 2021-10-26 NOTE — ED Provider Notes (Signed)
[facility]-EMERGENCY DEPT Provider Note   CSN: [id number] Arrival date & time: 10/26/21  1016     History  Chief Complaint  Patient presents with   Hypertension    Johnny Brooks is a 66 y.o. male who presents emergency department with a chief complaint of headache and hypertension.  Patient states that 2 days ago he developed flashes of light in his left eye this was followed by headache.  He took 2 aspirin.  Patient states that he just "felt off."  He had some mild nausea.  Patient states that when he was 66 years old he had 2 migraine headaches both of which were preceded by tunnel vision.  Light flashes were only in his left eye and have resolved.  Patient took 2 aspirin and aspirin with improvement.  At home his blood pressure cough should markedly elevated blood pressures 250/150.  Called his PCP and was told to come here for further evaluation.  He has no history of hypertension.   Hypertension       Home Medications Prior to Admission medications   Medication Sig Start Date End Date Taking? Authorizing Provider  CLARITIN 10 MG tablet  07/20/21   [provider]  omeprazole (PRILOSEC) 40 MG capsule Take 1 capsule (40 mg total) by mouth daily. 09/23/21   Georganna Skeans, MD  sildenafil (VIAGRA) 100 MG tablet Take 1 tablet (100 mg total) by mouth daily as needed for erectile dysfunction. 09/23/21   Georganna Skeans, MD  ZYRTEC ALLERGY 10 MG tablet  07/20/21   [provider]      Allergies    Patient has no known allergies.    Review of Systems   Review of Systems  Physical Exam Updated Vital Signs BP 138/89   Pulse 78   Temp 98.9 F (37.2 C) (Oral)   Resp 18   Ht 6' (1.829 m)   Wt 124.7 kg   SpO2 94%   BMI 37.30 kg/m  Physical Exam Vitals and nursing note reviewed.  Constitutional:      General: He is not in acute distress.    Appearance: He is well-developed. He is not diaphoretic.  HENT:     Head: Normocephalic and  atraumatic.     Nose: Nose normal.     Mouth/Throat:     Mouth: Mucous membranes are moist.  Eyes:     General: No scleral icterus.    Conjunctiva/sclera: Conjunctivae normal.     Pupils: Pupils are equal, round, and reactive to light.     Comments: No horizontal, vertical or rotational nystagmus  Neck:     Comments: Full active and passive ROM without pain No midline or paraspinal tenderness No nuchal rigidity or meningeal signs Cardiovascular:     Rate and Rhythm: Normal rate and regular rhythm.  Pulmonary:     Effort: Pulmonary effort is normal. No respiratory distress.     Breath sounds: No wheezing or rales.  Abdominal:     General: There is no distension.     Palpations: Abdomen is soft.     Tenderness: There is no abdominal tenderness. There is no guarding or rebound.  Musculoskeletal:        General: Normal range of motion.     Cervical back: Normal range of motion and neck supple.  Lymphadenopathy:     Cervical: No cervical adenopathy.  Skin:    General: Skin is warm and dry.     Findings: No rash.  Neurological:  Mental Status: He is alert and oriented to person, place, and time.     Cranial Nerves: No cranial nerve deficit.     Motor: No abnormal muscle tone.     Coordination: Coordination normal.     Comments: Mental Status:  Alert, oriented, thought content appropriate. Speech fluent without evidence of aphasia. Able to follow 2 step commands without difficulty.  Cranial Nerves:  II:  Peripheral visual fields grossly normal, pupils equal, round, reactive to light III,IV, VI: ptosis not present, extra-ocular motions intact bilaterally  V,VII: smile symmetric, facial light touch sensation equal VIII: hearing grossly normal bilaterally  IX,X: midline uvula rise  XI: bilateral shoulder shrug equal and strong XII: midline tongue extension  Motor:  5/5 in upper and lower extremities bilaterally including strong and equal grip strength and dorsiflexion/plantar  flexion Sensory: Pinprick and light touch normal in all extremities.  Cerebellar: normal finger-to-nose with bilateral upper extremities Gait: normal gait and balance CV: distal pulses palpable throughout   Psychiatric:        Behavior: Behavior normal.        Thought Content: Thought content normal.        Judgment: Judgment normal.     ED Results / Procedures / Treatments   Labs (all labs ordered are listed, but only abnormal results are displayed) Labs Reviewed  CBC WITH DIFFERENTIAL/PLATELET  BASIC METABOLIC PANEL  TSH    EKG None  Radiology CT Head Wo Contrast  Result Date: 10/26/2021 CLINICAL DATA:  Headaches EXAM: CT HEAD WITHOUT CONTRAST TECHNIQUE: Contiguous axial images were obtained from the base of the skull through the vertex without intravenous contrast. RADIATION DOSE REDUCTION: This exam was performed according to the departmental dose-optimization program which includes automated exposure control, adjustment of the mA and/or kV according to patient size and/or use of iterative reconstruction technique. COMPARISON:  None Available. FINDINGS: Brain: No acute intracranial findings are seen. There are no signs of bleeding within the cranium. Ventricles are not dilated. Cortical sulci are slightly prominent. Vascular: Unremarkable. Skull: Unremarkable. Sinuses/Orbits: Unremarkable. Other: None. IMPRESSION: No acute intracranial findings are seen in noncontrast CT brain. Electronically Signed   By: Ernie Avena M.D.   On: 10/26/2021 13:16    Procedures Procedures    Medications Ordered in ED Medications - No data to display  ED Course/ Medical Decision Making/ A&P                           Medical Decision Making 66 year old male here who primarily came in for hypertension. Given the large differential diagnosis for Johnny Brooks, the decision making in this case is of high complexity. The differential diagnosis for hypertension includes but is not limited  to hypertensive emergency, hypertensive urgency, stroke, sympathomimetic ingestion, acute pulmonary edema, ischemic stroke, intracranial hemorrhage, preeclampsia or eclampsia, autonomic dysreflexia, acute glomerulonephritis, type I MI, volume overload, urinary obstruction, pain, renal artery stenosis, polycystic kidney disease, Cushing syndrome, OSA, pheochromocytoma, hyperaldosteronism, hypothyroidism, anxiety Patient not having active headache today.  I doubt TIA given his symptoms.  Visual fields are intact I have extremely low suspicion for retinal detachment.  He is asymptomatic today and I suspect that his blood pressure cuff at home was poorly calibrated.     After evaluating all of the data points in this case, the presentation of Johnny Brooks is NOT consistent with skull fracture, meningitis/encephalitis, SAH/sentinel bleed, Intracranial Hemorrhage (ICH) (subdural/epidural), acute obstructive hydrocephalus, space occupying lesions, CVA, CO  Poisoning, Basilar/vertebral artery dissection, preeclampsia, cerebral venous thrombosis, hypertensive emergency, temporal Arteritis, Idiopathic Intracranial Hypertension (pseudotumor cerebri).  Strict return and follow-up precautions have been given by me personally or by detailed written instructions verbalized by nursing staff using the teach back method to patient/family/caregiver.  Data Reviewed/Counseling: I have reviewed the patient's vital signs, nursing notes, and other relevant tests/information. I had a detailed discussion regarding the historical points, exam findings, and any diagnostic results supporting the discharge diagnosis. I also discussed the need for outpatient follow-up and the need to return to the ED if symptoms worsen or if there are any questions or concerns that arise at hom    Amount and/or Complexity of Data Reviewed Labs: ordered.    Details: CBC and BMP are unremarkable Radiology: ordered and independent interpretation  performed.    Details: I ordered and visualized as well as interpreted CT head which shows no acute findings.      Final Clinical Impression(s) / ED Diagnoses Final diagnoses:  Bad headache  Elevated blood pressure reading    Rx / DC Orders ED Discharge Orders     None         Arthor Captain, PA-C 10/26/21 1343    Mancel Bale, MD 10/27/21 1123

## 2021-10-27 NOTE — Telephone Encounter (Signed)
For provider information

## 2022-02-12 ENCOUNTER — Telehealth: Payer: Self-pay

## 2022-02-12 ENCOUNTER — Ambulatory Visit (INDEPENDENT_AMBULATORY_CARE_PROVIDER_SITE_OTHER): Payer: Medicare Other

## 2022-02-12 DIAGNOSIS — Z0000 Encounter for general adult medical examination without abnormal findings: Secondary | ICD-10-CM

## 2022-02-12 NOTE — Progress Notes (Deleted)
Subjective:   Johnny Brooks is a 66 y.o. male who presents for an Initial Medicare Annual Wellness Visit.  Review of Systems    ***       Objective:    There were no vitals filed for this visit. There is no height or weight on file to calculate BMI.     10/26/2021   11:19 AM 02/27/2013    8:00 AM 02/19/2013   10:39 AM  Advanced Directives  Does Patient Have a Medical Advance Directive? Yes Patient would not like information Patient would not like information  Type of Advance Directive [place];Living will    Pre-existing out of facility DNR order (yellow form or pink MOST form)  No     Current Medications (verified) Outpatient Encounter Medications as of 02/12/2022  Medication Sig  . CLARITIN 10 MG tablet   . omeprazole (PRILOSEC) 40 MG capsule Take 1 capsule (40 mg total) by mouth daily.  . sildenafil (VIAGRA) 100 MG tablet Take 1 tablet (100 mg total) by mouth daily as needed for erectile dysfunction.  Marland Kitchen ZYRTEC ALLERGY 10 MG tablet    No facility-administered encounter medications on file as of 02/12/2022.    Allergies (verified) Patient has no known allergies.   History: Past Medical History:  Diagnosis Date  . Arthritis    knees  . Left bundle branch block    Past Surgical History:  Procedure Laterality Date  . ANKLE SURGERY     right  . arthroscopies     numerous on knees  . dislocated clavicle     right  . KNEE SURGERY     left  1973  . 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 Social History   Socioeconomic History  . Marital status: Widowed    Spouse name: Not on file  . Number of children: Not on file  . Years of education: Not on file  . Highest education level: Not on file  Occupational History  . Not on file  Tobacco Use  . Smoking status: Former     Types: Cigars  . Smokeless tobacco: Never  Vaping Use  . Vaping Use: Never used  Substance and Sexual Activity  . Alcohol use: Yes    Alcohol/week: 3.0 standard drinks of alcohol    Types: 3 Cans of beer per week  . Drug use: No  . Sexual activity: Not on file  Other Topics Concern  . Not on file  Social History Narrative  . Not on file   Social Determinants of Health   Financial Resource Strain: Not on file  Food Insecurity: Not on file  Transportation Needs: Not on file  Physical Activity: Not on file  Stress: Not on file  Social Connections: Not on file    Tobacco Counseling Counseling given: Not Answered   Clinical Intake:                 Diabetic?***         Activities of Daily Living     No data to display           Patient Care Team: Dorna Mai, MD as PCP - General (Family Medicine)  Indicate any recent Medical Services you may have received from other than [facility] providers in the past year (date may be approximate).  Assessment:   This is a routine wellness examination for Johnny Brooks.  Hearing/Vision screen No results found.  Dietary issues and exercise activities discussed:     Goals Addressed   None   Depression Screen    09/23/2021    9:38 AM 07/14/2021    9:25 AM 01/20/2021    3:16 PM 06/06/2019    1:57 PM  PHQ 2/9 Scores  PHQ - 2 Score 0 0 0 0  PHQ- 9 Score 0 1 0     Fall Risk    01/20/2021    3:14 PM 06/06/2019    1:50 PM  Fall Risk   Falls in the past year? 0 0  Number falls in past yr:  0  Injury with Fall?  0    FALL RISK PREVENTION PERTAINING TO THE HOME:  Any stairs in or around the home? {YES/NO:21197} If so, are there any without handrails? {YES/NO:21197} Home free of loose throw rugs in walkways, pet beds, electrical cords, etc? {YES/NO:21197} Adequate lighting in your home to reduce risk of falls? {YES/NO:21197}  ASSISTIVE DEVICES UTILIZED TO PREVENT FALLS:  Life alert? {YES/NO:21197} Use  of a cane, walker or w/c? {YES/NO:21197} Grab bars in the bathroom? {YES/NO:21197} Shower chair or bench in shower? {YES/NO:21197} Elevated toilet seat or a handicapped toilet? {YES/NO:21197}  TIMED UP AND GO:  Was the test performed? {YES/NO:21197}.  Length of time to ambulate 10 feet: *** sec.   {Appearance of PIRJ:1884166}  Cognitive Function:        Immunizations Immunization History  Administered Date(s) Administered  . Influenza,inj,Quad PF,6+ Mos 01/18/2020, 01/20/2021  . Pneumococcal Conjugate-13 01/18/2020    {TDAP status:2101805}  {Flu Vaccine status:2101806}  {Pneumococcal vaccine status:2101807}  {Covid-19 vaccine status:2101808}  Qualifies for Shingles Vaccine? {YES/NO:21197}  Zostavax completed {YES/NO:21197}  {Shingrix Completed?:2101804}  Screening Tests Health Maintenance  Topic Date Due  . INFLUENZA VACCINE  10/27/2021  . Pneumonia Vaccine 31+ Years old (2 - PPSV23 or PCV20) 03/28/2022 (Originally 01/17/2021)  . COVID-19 Vaccine (1) 03/28/2022 (Originally 12/06/1955)  . Zoster Vaccines- Shingrix (1 of 2) 03/28/2022 (Originally 06/04/2004)  . COLONOSCOPY (Pts 45-70yrs Insurance coverage will need to be confirmed)  07/15/2022 (Originally 06/05/1999)  . Medicare Annual Wellness (AWV)  02/13/2023  . Hepatitis C Screening  Completed  . HPV VACCINES  Aged Out    Health Maintenance  Health Maintenance Due  Topic Date Due  . INFLUENZA VACCINE  10/27/2021    {Colorectal cancer screening:2101809}  Lung Cancer Screening: (Low Dose CT Chest recommended if Age 77-80 years, 30 pack-year currently smoking OR have quit w/in 15years.) {DOES NOT does:27190::"does not"} qualify.   Lung Cancer Screening Referral: ***  Additional Screening:  Hepatitis C Screening: {DOES NOT does:27190::"does not"} qualify; Completed ***  Vision Screening: Recommended annual ophthalmology exams for early detection of glaucoma and other disorders of the eye. Is the patient up  to date with their annual eye exam?  {YES/NO:21197} Who is the provider or what is the name of the office in which the patient attends annual eye exams? *** If pt is not established with a provider, would they like to be referred to a provider to establish care? {YES/NO:21197}.   Dental Screening: Recommended annual dental exams for proper oral hygiene  Community Resource Referral / Chronic Care Management: CRR required this visit?  {YES/NO:21197}  CCM required this visit?  {YES/NO:21197}     Plan:     I have personally reviewed and noted the following in the patient's chart:   Medical  and social history Use of alcohol, tobacco or illicit drugs  Current medications and supplements including opioid prescriptions. {Opioid [phone]} Functional ability and status Nutritional status Physical activity Advanced directives List of other physicians Hospitalizations, surgeries, and ER visits in previous 12 months Vitals Screenings to include cognitive, depression, and falls Referrals and appointments  In addition, I have reviewed and discussed with patient certain preventive protocols, quality metrics, and best practice recommendations. A written personalized care plan for preventive services as well as general preventive health recommendations were provided to patient.     Debbora Dus, [place]   02/12/2022   Nurse Notes: ***

## 2022-02-12 NOTE — Telephone Encounter (Signed)
Called patient to complete AWV. Patient refused to complete AWV being he felt questions asked in the SDOH category had nothing to do with his health. Patient stated he did not want to continue the rest of the visit, tried to explaining to patient the purpose of the visit is to update demographics for insurance purposes, this is done every year. Patient still declined to proceed rest of the visit. Patient notified he can give his home office a call if any concerns. Patient stated he had no concerns he felt the questions were irrelevant & had nothing to do with health. Patients visit incomplete. Patient did not say if he was interested in rescheduling.

## 2022-02-12 NOTE — Patient Instructions (Signed)
Health Maintenance, Male Adopting a healthy lifestyle and getting preventive care are important in promoting health and wellness. Ask your health care provider about: The right schedule for you to have regular tests and exams. Things you can do on your own to prevent diseases and keep yourself healthy. What should I know about diet, weight, and exercise? Eat a healthy diet  Eat a diet that includes plenty of vegetables, fruits, low-fat dairy products, and lean protein. Do not eat a lot of foods that are high in solid fats, added sugars, or sodium. Maintain a healthy weight Body mass index (BMI) is a measurement that can be used to identify possible weight problems. It estimates body fat based on height and weight. Your health care provider can help determine your BMI and help you achieve or maintain a healthy weight. Get regular exercise Get regular exercise. This is one of the most important things you can do for your health. Most adults should: Exercise for at least 150 minutes each week. The exercise should increase your heart rate and make you sweat (moderate-intensity exercise). Do strengthening exercises at least twice a week. This is in addition to the moderate-intensity exercise. Spend less time sitting. Even light physical activity can be beneficial. Watch cholesterol and blood lipids Have your blood tested for lipids and cholesterol at 67 years of age, then have this test every 5 years. You may need to have your cholesterol levels checked more often if: Your lipid or cholesterol levels are high. You are older than 67 years of age. You are at high risk for heart disease. What should I know about cancer screening? Many types of cancers can be detected early and may often be prevented. Depending on your health history and family history, you may need to have cancer screening at various ages. This may include screening for: Colorectal cancer. Prostate cancer. Skin cancer. Lung  cancer. What should I know about heart disease, diabetes, and high blood pressure? Blood pressure and heart disease High blood pressure causes heart disease and increases the risk of stroke. This is more likely to develop in people who have high blood pressure readings or are overweight. Talk with your health care provider about your target blood pressure readings. Have your blood pressure checked: Every 3-5 years if you are 18-39 years of age. Every year if you are 40 years old or older. If you are between the ages of 65 and 75 and are a current or former smoker, ask your health care provider if you should have a one-time screening for abdominal aortic aneurysm (AAA). Diabetes Have regular diabetes screenings. This checks your fasting blood sugar level. Have the screening done: Once every three years after age 45 if you are at a normal weight and have a low risk for diabetes. More often and at a younger age if you are overweight or have a high risk for diabetes. What should I know about preventing infection? Hepatitis B If you have a higher risk for hepatitis B, you should be screened for this virus. Talk with your health care provider to find out if you are at risk for hepatitis B infection. Hepatitis C Blood testing is recommended for: Everyone born from 1945 through 1965. Anyone with known risk factors for hepatitis C. Sexually transmitted infections (STIs) You should be screened each year for STIs, including gonorrhea and chlamydia, if: You are sexually active and are younger than 67 years of age. You are older than 67 years of age and your   health care provider tells you that you are at risk for this type of infection. Your sexual activity has changed since you were last screened, and you are at increased risk for chlamydia or gonorrhea. Ask your health care provider if you are at risk. Ask your health care provider about whether you are at high risk for HIV. Your health care provider  may recommend a prescription medicine to help prevent HIV infection. If you choose to take medicine to prevent HIV, you should first get tested for HIV. You should then be tested every 3 months for as long as you are taking the medicine. Follow these instructions at home: Alcohol use Do not drink alcohol if your health care provider tells you not to drink. If you drink alcohol: Limit how much you have to 0-2 drinks a day. Know how much alcohol is in your drink. In the [place] one drink equals one 12 oz bottle of beer (355 mL), one 5 oz glass of wine (148 mL), or one 1 oz glass of hard liquor (44 mL). Lifestyle Do not use any products that contain nicotine or tobacco. These products include cigarettes, chewing tobacco, and vaping devices, such as e-cigarettes. If you need help quitting, ask your health care provider. Do not use street drugs. Do not share needles. Ask your health care provider for help if you need support or information about quitting drugs. General instructions Schedule regular health, dental, and eye exams. Stay current with your vaccines. Tell your health care provider if: You often feel depressed. You have ever been abused or do not feel safe at home. Summary Adopting a healthy lifestyle and getting preventive care are important in promoting health and wellness. Follow your health care provider's instructions about healthy diet, exercising, and getting tested or screened for diseases. Follow your health care provider's instructions on monitoring your cholesterol and blood pressure. This information is not intended to replace advice given to you by your health care provider. Make sure you discuss any questions you have with your health care provider. Document Revised: 08/04/2020 Document Reviewed: 08/04/2020 Elsevier Patient Education  2023 [employer] Inc.  

## 2022-02-22 ENCOUNTER — Ambulatory Visit: Payer: Self-pay | Admitting: *Deleted

## 2022-02-22 NOTE — Telephone Encounter (Signed)
  Chief Complaint: Tingling in legs and hands Symptoms: Tingling, "Like when you foot falls asleep", both legs and hands. States "Only when I sit still for a while." Frequency: 1 week Pertinent Negatives: Patient denies back pain, injury Disposition: [] ED /[] Urgent Care (no appt availability in office) / [x] Appointment(In office/virtual)/ []  [facility]/ [] Home Care/ [] Refused Recommended Disposition /[] [facility]/ []  Follow-up with PCP Additional Notes: Secured first available and placed on wait list. Advised ED for worsening symptoms. Pt verbalizes understanding.

## 2022-02-22 NOTE — Telephone Encounter (Signed)
Reason for Disposition  [1] Numbness or tingling in one or both feet AND [2] is a chronic symptom (recurrent or ongoing AND present > 4 weeks)  Answer Assessment - Initial Assessment Questions 1. SYMPTOM: "What is the main symptom you are concerned about?" (e.g., weakness, numbness)     Numbness  More like a tingling  in legs and hands 2. ONSET: "When did this start?" (minutes, hours, days; while sleeping)   1 week    3. LAST NORMAL: "When was the last time you (the patient) were normal (no symptoms)?"     1 week ago 4. PATTERN "Does this come and go, or has it been constant since it started?"  "Is it present now?"     Comes and goes. More when I am still 5. CARDIAC SYMPTOMS: "Have you had any of the following symptoms: chest pain, difficulty breathing, palpitations?"      6. NEUROLOGIC SYMPTOMS: "Have you had any of the following symptoms: headache, dizziness, vision loss, double vision, changes in speech, unsteady on your feet?"     Flashes in right eye, eye doctor said gel breaking loose causing floaters, 1 month ago 7. OTHER SYMPTOMS: "Do you have any other symptoms?"     None other than eye floaters  Protocols used: Neurologic Deficit-A-AH

## 2022-03-01 ENCOUNTER — Encounter: Payer: Self-pay | Admitting: Family Medicine

## 2022-03-01 ENCOUNTER — Ambulatory Visit (INDEPENDENT_AMBULATORY_CARE_PROVIDER_SITE_OTHER): Payer: Medicare Other | Admitting: Family Medicine

## 2022-03-01 VITALS — BP 140/92 | HR 89 | Temp 98.1°F | Resp 16 | Wt 277.8 lb

## 2022-03-01 DIAGNOSIS — Z0000 Encounter for general adult medical examination without abnormal findings: Secondary | ICD-10-CM

## 2022-03-01 DIAGNOSIS — M79605 Pain in left leg: Secondary | ICD-10-CM

## 2022-03-01 DIAGNOSIS — Z0001 Encounter for general adult medical examination with abnormal findings: Secondary | ICD-10-CM | POA: Diagnosis not present

## 2022-03-01 DIAGNOSIS — M79604 Pain in right leg: Secondary | ICD-10-CM | POA: Diagnosis not present

## 2022-03-01 DIAGNOSIS — Z23 Encounter for immunization: Secondary | ICD-10-CM | POA: Diagnosis not present

## 2022-03-01 NOTE — Progress Notes (Unsigned)
Patient c/o legs getting numbness when he is not moving around.Patient said that this has been going on for 2 weeks. Patient said he took an ASA it seems to help.   Subjective:   Johnny Brooks is a 66 y.o. male who presents for Medicare Annual/Subsequent preventive examination.  Review of Systems    Refer to pcp       Objective:    Today's Vitals   03/01/22 1541 03/01/22 1547  BP: (!) 143/97 (!) 140/92  Pulse: 89   Resp: 16   Temp: 98.1 F (36.7 C)   TempSrc: Oral   SpO2: 96%   Weight: 277 lb 12.8 oz (126 kg)    Body mass index is 37.68 kg/m.     10/26/2021   11:19 AM 02/27/2013    8:00 AM 02/19/2013   10:39 AM  Advanced Directives  Does Patient Have a Medical Advance Directive? Yes Patient would not like information Patient would not like information  Type of Advance Directive Healthcare Power of [place];Living will    Pre-existing out of facility DNR order (yellow form or pink MOST form)  No     Current Medications (verified) Outpatient Encounter Medications as of 03/01/2022  Medication Sig   CLARITIN 10 MG tablet    omeprazole (PRILOSEC) 40 MG capsule Take 1 capsule (40 mg total) by mouth daily.   sildenafil (VIAGRA) 100 MG tablet Take 1 tablet (100 mg total) by mouth daily as needed for erectile dysfunction.   ZYRTEC ALLERGY 10 MG tablet    No facility-administered encounter medications on file as of 03/01/2022.    Allergies (verified) Patient has no known allergies.   History: Past Medical History:  Diagnosis Date   Arthritis    knees   Left bundle branch block    Past Surgical History:  Procedure Laterality Date   ANKLE SURGERY     right   arthroscopies     numerous on knees   dislocated clavicle     right   KNEE SURGERY     left  1973   TOTAL KNEE ARTHROPLASTY Bilateral 02/26/2013   Procedure: TOTAL KNEE BILATERAL;  Surgeon: Nestor Lewandowsky, MD;  Location: [facility] OR;  Service: Orthopedics;  Laterality: Bilateral;   No family history on  file. Social History   Socioeconomic History   Marital status: Widowed    Spouse name: Not on file   Number of children: Not on file   Years of education: Not on file   Highest education level: Not on file  Occupational History   Not on file  Tobacco Use   Smoking status: Former    Types: Cigars   Smokeless tobacco: Never  Vaping Use   Vaping Use: Never used  Substance and Sexual Activity   Alcohol use: Yes    Alcohol/week: 3.0 standard drinks of alcohol    Types: 3 Cans of beer per week   Drug use: No   Sexual activity: Not on file  Other Topics Concern   Not on file  Social History Narrative   Not on file   Social Determinants of Health   Financial Resource Strain: Not on file  Food Insecurity: No Food Insecurity (02/12/2022)   Hunger Vital Sign    Worried About Running Out of Food in the Last Year: Never true    Ran Out of Food in the Last Year: Never true  Transportation Needs: No Transportation Needs (02/12/2022)   PRAPARE - Transportation    Lack of Transportation (  Medical): No    Lack of Transportation (Non-Medical): No  Physical Activity: Not on file  Stress: Not on file  Social Connections: Unknown (02/12/2022)   Social Connection and Isolation Panel [NHANES]    Frequency of Communication with Friends and Family: More than three times a week    Frequency of Social Gatherings with Friends and Family: Not on file    Attends Religious Services: Never    Database administrator or Organizations: No    Attends Engineer, structural: Never    Marital Status: Not on file    Tobacco Counseling Counseling given: Not Answered   Clinical Intake:  Pre-visit preparation completed: No  Pain : No/denies pain     Diabetes: No     Diabetic?n/a  Interpreter Needed?: No      Activities of Daily Living     No data to display           Patient Care Team: Georganna Skeans, MD as PCP - General (Family Medicine)  Indicate any recent Medical  Services you may have received from other than [facility] providers in the past year (date may be approximate).     Assessment:   This is a routine wellness examination for Johnny Brooks.  Hearing/Vision screen No results found.  Dietary issues and exercise activities discussed:     Goals Addressed   None   Depression Screen    09/23/2021    9:38 AM 07/14/2021    9:25 AM 01/20/2021    3:16 PM 06/06/2019    1:57 PM  PHQ 2/9 Scores  PHQ - 2 Score 0 0 0 0  PHQ- 9 Score 0 1 0     Fall Risk    01/20/2021    3:14 PM 06/06/2019    1:50 PM  Fall Risk   Falls in the past year? 0 0  Number falls in past yr:  0  Injury with Fall?  0    FALL RISK PREVENTION PERTAINING TO THE HOME:  Any stairs in or around the home? No  If so, are there any without handrails? No  Home free of loose throw rugs in walkways, pet beds, electrical cords, etc? Yes  Adequate lighting in your home to reduce risk of falls? Yes   ASSISTIVE DEVICES UTILIZED TO PREVENT FALLS:  Life alert? No  Use of a cane, walker or w/c? No  Grab bars in the bathroom? No  Shower chair or bench in shower? No  Elevated toilet seat or a handicapped toilet? No   TIMED UP AND GO:  Was the test performed? Yes .  Length of time to ambulate 10 feet: 60 sec.   Gait steady and fast with assistive device  Cognitive Function:        Immunizations Immunization History  Administered Date(s) Administered   Fluad Quad(high Dose 65+) 03/01/2022   Influenza,inj,Quad PF,6+ Mos 01/18/2020, 01/20/2021   Pneumococcal Conjugate-13 01/18/2020    TDAP status: Due, Education has been provided regarding the importance of this vaccine. Advised may receive this vaccine at local pharmacy or Health Dept. Aware to provide a copy of the vaccination record if obtained from local pharmacy or Health Dept. Verbalized acceptance and understanding.  Flu Vaccine status: Up to date  Pneumococcal vaccine status: Declined,  Education has been provided  regarding the importance of this vaccine but patient still declined. Advised may receive this vaccine at local pharmacy or Health Dept. Aware to provide a copy of the vaccination record if obtained  from local pharmacy or Health Dept. Verbalized acceptance and understanding.   Covid-19 vaccine status: Completed vaccines  Qualifies for Shingles Vaccine? No   Zostavax completed No   Shingrix Completed?: No.    Education has been provided regarding the importance of this vaccine. Patient has been advised to call insurance company to determine out of pocket expense if they have not yet received this vaccine. Advised may also receive vaccine at local pharmacy or Health Dept. Verbalized acceptance and understanding.  Screening Tests Health Maintenance  Topic Date Due   Medicare Annual Wellness (AWV)  Never done   Pneumonia Vaccine 61+ Years old (2 - PPSV23 or PCV20) 03/28/2022 (Originally 01/17/2021)   COVID-19 Vaccine (1) 03/28/2022 (Originally 12/06/1955)   Zoster Vaccines- Shingrix (1 of 2) 03/28/2022 (Originally 06/04/2004)   COLONOSCOPY (Pts 45-38yrs Insurance coverage will need to be confirmed)  07/15/2022 (Originally 06/05/1999)   INFLUENZA VACCINE  Completed   Hepatitis C Screening  Completed   HPV VACCINES  Aged Out   DTaP/Tdap/Td  Discontinued    Health Maintenance  Health Maintenance Due  Topic Date Due   Medicare Annual Wellness (AWV)  Never done    Colorectal cancer screening: Type of screening: Colonoscopy. Completed n/a. Repeat every   years  Lung Cancer Screening: (Low Dose CT Chest recommended if Age 68-80 years, 30 pack-year currently smoking OR have quit w/in 15years.) does qualify.   Lung Cancer Screening Referral: n/a   Additional Screening:  Hepatitis C Screening: does qualify; Completed 06/06/2019  Vision Screening: Recommended annual ophthalmology exams for early detection of glaucoma and other disorders of the eye. Is the patient up to date with their annual eye  exam?  No  Who is the provider or what is the name of the office in which the patient attends annual eye exams?  If pt is not established with a provider, would they like to be referred to a provider to establish care? No .   Dental Screening: Recommended annual dental exams for proper oral hygiene  Community Resource Referral / Chronic Care Management: CRR required this visit?  No   CCM required this visit?  No      Plan:     I have personally reviewed and noted the following in the patient's chart:   Medical and social history Use of alcohol, tobacco or illicit drugs  Current medications and supplements including opioid prescriptions. Patient is not currently taking opioid prescriptions. Functional ability and status Nutritional status Physical activity Advanced directives List of other physicians Hospitalizations, surgeries, and ER visits in previous 12 months Vitals Screenings to include cognitive, depression, and falls Referrals and appointments  In addition, I have reviewed and discussed with patient certain preventive protocols, quality metrics, and best practice recommendations. A written personalized care plan for preventive services as well as general preventive health recommendations were provided to patient.     Kieth Brightly, RMA   03/01/2022   Nurse Notes:

## 2022-03-02 ENCOUNTER — Encounter: Payer: Self-pay | Admitting: Family Medicine

## 2022-03-02 NOTE — Progress Notes (Signed)
Established Patient Office Visit  Subjective    Patient ID: Johnny Brooks, male    DOB: 01/05/1956  Age: 66 y.o. MRN: [id number]  CC: No chief complaint on file.   HPI Johnny Brooks presents with complaint of bilateral leg pain. Patient denies known trauma or injury. Also with some numbness and tingling.    Outpatient Encounter Medications as of 03/01/2022  Medication Sig   CLARITIN 10 MG tablet    omeprazole (PRILOSEC) 40 MG capsule Take 1 capsule (40 mg total) by mouth daily.   sildenafil (VIAGRA) 100 MG tablet Take 1 tablet (100 mg total) by mouth daily as needed for erectile dysfunction.   ZYRTEC ALLERGY 10 MG tablet    No facility-administered encounter medications on file as of 03/01/2022.    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  Social History   Socioeconomic History   Marital status: Widowed    Spouse name: Not on file   Number of children: Not on file   Years of education: Not on file   Highest education level: Not on file  Occupational History   Not on file  Tobacco Use   Smoking status: Former    Types: Cigars   Smokeless tobacco: Never  Vaping Use   Vaping Use: Never used  Substance and Sexual Activity   Alcohol use: Yes    Alcohol/week: 3.0 standard drinks of alcohol    Types: 3 Cans of beer per week   Drug use: No   Sexual activity: Not on file  Other Topics Concern   Not on file  Social History Narrative   Not on file   Social Determinants of Health   Financial Resource Strain: Not on file  Food  Insecurity: No Food Insecurity (02/12/2022)   Hunger Vital Sign    Worried About Running Out of Food in the Last Year: Never true    Ran Out of Food in the Last Year: Never true  Transportation Needs: No Transportation Needs (02/12/2022)   PRAPARE - Administrator, Civil Service (Medical): No    Lack of Transportation (Non-Medical): No  Physical Activity: Not on file  Stress: Not on file  Social Connections: Unknown (02/12/2022)   Social Connection and Isolation Panel [NHANES]    Frequency of Communication with Friends and Family: More than three times a week    Frequency of Social Gatherings with Friends and Family: Not on file    Attends Religious Services: Never    Active Member of Clubs or Organizations: No    Attends Banker Meetings: Never    Marital Status: Not on file  Intimate Partner Violence: Not At Risk (02/12/2022)   Humiliation, Afraid, Rape, and Kick questionnaire    Fear of Current or Ex-Partner: No    Emotionally Abused: No    Physically Abused: No    Sexually Abused: No    Review of  Systems  All other systems reviewed and are negative.       Objective    BP (!) 140/92   Pulse 89   Temp 98.1 F (36.7 C) (Oral)   Resp 16   Wt 277 lb 12.8 oz (126 kg)   SpO2 96%   BMI 37.68 kg/m   Physical Exam Vitals and nursing note reviewed.  Constitutional:      General: He is not in acute distress. Cardiovascular:     Rate and Rhythm: Normal rate and regular rhythm.  Pulmonary:     Effort: Pulmonary effort is normal.     Breath sounds: Normal breath sounds.  Musculoskeletal:     Right lower leg: No swelling, deformity or tenderness. No edema.     Left lower leg: No swelling, deformity or tenderness. No edema.  Neurological:     General: No focal deficit present.     Mental Status: He is alert and oriented to person, place, and time.         Assessment & Plan:   1. Bilateral leg pain Referral to vascular for further  eval/mgt. Discussed leg elevation and use of support socks.  - Ambulatory referral to Vascular Surgery  2. Encounter for Medicare annual wellness exam   3. Need for immunization against influenza  - Flu Vaccine QUAD High Dose(Fluad)    No follow-ups on file.   Tommie Raymond, MD

## 2022-03-18 ENCOUNTER — Other Ambulatory Visit: Payer: Self-pay | Admitting: *Deleted

## 2022-03-18 DIAGNOSIS — M79604 Pain in right leg: Secondary | ICD-10-CM

## 2022-03-19 ENCOUNTER — Ambulatory Visit (HOSPITAL_COMMUNITY)
Admission: RE | Admit: 2022-03-19 | Discharge: 2022-03-19 | Disposition: A | Discharge status: Home / Self Care | Payer: Medicare Other | Source: Ambulatory Visit | Attending: Vascular Surgery | Admitting: Vascular Surgery

## 2022-03-19 ENCOUNTER — Encounter: Payer: Self-pay | Admitting: Vascular Surgery

## 2022-03-19 ENCOUNTER — Ambulatory Visit: Payer: Medicare Other | Admitting: Vascular Surgery

## 2022-03-19 VITALS — BP 125/89 | HR 84 | Temp 97.9°F | Resp 20 | Ht 72.0 in | Wt 274.0 lb

## 2022-03-19 DIAGNOSIS — M79604 Pain in right leg: Secondary | ICD-10-CM | POA: Diagnosis not present

## 2022-03-19 DIAGNOSIS — M79651 Pain in right thigh: Secondary | ICD-10-CM | POA: Diagnosis not present

## 2022-03-19 DIAGNOSIS — M79605 Pain in left leg: Secondary | ICD-10-CM | POA: Diagnosis not present

## 2022-03-19 DIAGNOSIS — M79652 Pain in left thigh: Secondary | ICD-10-CM | POA: Diagnosis not present

## 2022-03-19 NOTE — Progress Notes (Signed)
Office Note     CC: Bilateral lower extremity thigh pain Requesting Provider:  Georganna Skeans, MD  HPI: Johnny Brooks is a 66 y.o. (May 06, 1955) male presenting at the request of .Georganna Skeans, MD lateral lower extremity thigh pain.   On exam, Jamarl was doing well.  Originally from [place], he has lived in this area for most of his life.  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  Over the last 30 years, he is appreciated pain in his thighs.  This is worse when he is standing, but alleviated by ambulation.  Weeks ago, he appreciated numbness and tingling in bilateral lower extremities.  This was most appreciated at rest and at the level of the tibia and running into the feet.  Fortunately, this resolved. Tim denies symptoms of claudication, ischemic rest pain, tissue loss.  Past Medical History:  Diagnosis Date   Arthritis    knees   Left bundle branch block     Past Surgical History:  Procedure Laterality Date   ANKLE SURGERY     right   arthroscopies     numerous on knees   dislocated clavicle     right   KNEE SURGERY     left  1973   TOTAL KNEE ARTHROPLASTY Bilateral 02/26/2013   Procedure: TOTAL KNEE BILATERAL;  Surgeon: Nestor Lewandowsky, MD;  Location: [facility] OR;  Service: Orthopedics;  Laterality: Bilateral;    Social History   Socioeconomic History   Marital status: Widowed    Spouse name: Not on file   Number of children: Not on file   Years of education: Not on file   Highest education level: Not on file  Occupational History   Not on file  Tobacco Use   Smoking status: Former    Types: Cigars   Smokeless tobacco: Never  Vaping Use   Vaping Use: Never used  Substance and Sexual Activity   Alcohol use: Yes    Alcohol/week: 3.0 standard drinks of alcohol    Types: 3 Cans of beer per week   Drug use: No   Sexual activity: Not on file  Other Topics Concern   Not  on file  Social History Narrative   Not on file   Social Determinants of Health   Financial Resource Strain: Not on file  Food Insecurity: No Food Insecurity (02/12/2022)   Hunger Vital Sign    Worried About Running Out of Food in the Last Year: Never true    Ran Out of Food in the Last Year: Never true  Transportation Needs: No Transportation Needs (02/12/2022)   PRAPARE - Administrator, Civil Service (Medical): No    Lack of Transportation (Non-Medical): No  Physical Activity: Not on file  Stress: Not on file  Social Connections: Unknown (02/12/2022)   Social Connection and Isolation Panel [NHANES]    Frequency of Communication with Friends and Family: More than three times a week    Frequency of Social Gatherings with Friends and Family: Not on file    Attends Religious Services: Never    Active Member of Clubs or Organizations: No    Attends Banker Meetings: Never    Marital Status: Not on file  Intimate Partner Violence: Not At Risk (02/12/2022)   Humiliation, Afraid, Rape, and Kick questionnaire    Fear of Current or Ex-Partner: No  Emotionally Abused: No    Physically Abused: No    Sexually Abused: No   No family history on file.  Current Outpatient Medications  Medication Sig Dispense Refill   CLARITIN 10 MG tablet      omeprazole (PRILOSEC) 40 MG capsule Take 1 capsule (40 mg total) by mouth daily. 90 capsule 3   sildenafil (VIAGRA) 100 MG tablet Take 1 tablet (100 mg total) by mouth daily as needed for erectile dysfunction. 30 tablet 3   ZYRTEC ALLERGY 10 MG tablet      No current facility-administered medications for this visit.    No Known Allergies   REVIEW OF SYSTEMS:   [X]  denotes positive finding, [ ]  denotes negative finding Cardiac  Comments:  Chest pain or chest pressure:    Shortness of breath upon exertion:    Short of breath when lying flat:    Irregular heart rhythm:        Vascular    Pain in calf, thigh, or  hip brought on by ambulation:    Pain in feet at night that wakes you up from your sleep:     Blood clot in your veins:    Leg swelling:         Pulmonary    Oxygen at home:    Productive cough:     Wheezing:         Neurologic    Sudden weakness in arms or legs:     Sudden numbness in arms or legs:     Sudden onset of difficulty speaking or slurred speech:    Temporary loss of vision in one eye:     Problems with dizziness:         Gastrointestinal    Blood in stool:     Vomited blood:         Genitourinary    Burning when urinating:     Blood in urine:        Psychiatric    Major depression:         Hematologic    Bleeding problems:    Problems with blood clotting too easily:        Skin    Rashes or ulcers:        Constitutional    Fever or chills:      PHYSICAL EXAMINATION:  There were no vitals filed for this visit.  General:  WDWN in NAD; vital signs documented above Gait: Not observed HENT: WNL, normocephalic Pulmonary: normal non-labored breathing , without wheezing Cardiac: regular HR, Abdomen: soft, NT, no masses Skin: without rashes Vascular Exam/Pulses:  Right Left  Radial 2+ (normal) 2+ (normal)  Ulnar    Femoral    Popliteal    DP 2+ (normal) 2+ (normal)  PT 2+ (normal) 2+ (normal)   Extremities: without ischemic changes, without Gangrene , without cellulitis; without open wounds;  Musculoskeletal: no muscle wasting or atrophy  Neurologic: A&O X 3;  No focal weakness or paresthesias are detected Psychiatric:  The pt has Normal affect.   Non-Invasive Vascular Imaging:   ABI Findings:  +---------+------------------+-----+---------+--------+  Right   Rt Pressure (mmHg)IndexWaveform Comment   +---------+------------------+-----+---------+--------+  Brachial 144                                       +---------+------------------+-----+---------+--------+  PTA     172  1.11 triphasic           +---------+------------------+-----+---------+--------+  DP      170               1.10 triphasic          +---------+------------------+-----+---------+--------+  Great Toe114               0.74 Normal             +---------+------------------+-----+---------+--------+   +---------+------------------+-----+---------+-------+  Left    Lt Pressure (mmHg)IndexWaveform Comment  +---------+------------------+-----+---------+-------+  Brachial 155                                      +---------+------------------+-----+---------+-------+  PTA     187               1.21 triphasic         +---------+------------------+-----+---------+-------+  DP      161               1.04 triphasic         +---------+------------------+-----+---------+-------+  Cletis Media               0.72 Normal            +---------+------------------+-----+---------+-------+   +-------+-----------+-----------+------------+------------+  ABI/TBIToday's ABIToday's TBIPrevious ABIPrevious TBI  +-------+-----------+-----------+------------+------------+  Right 1.11       0.74                                 +-------+-----------+-----------+------------+------------+  Left  1.21       0.72                                 +-------+-----------+-----------+------------+------------+      ASSESSMENT/PLAN: NAMIR NETO is a 66 y.o. male presenting with bilateral lower extremity and in his thighs most appreciated when standing.  This is alleviated with ambulation.  This is not typical of peripheral arterial disease.  ABI was reviewed and found to be normal.  His exam, he had palpable pulses in the feet.  Had a long discussion regarding the above.  I do not have a vascular etiology as to why he is having the thigh cramping, nor intermittent lower extremity peripheral neuropathy.  Discussed referral to orthospine, however Tim elected to hold off at this time.  If  symptoms worsen recommend referral.    Victorino Sparrow, MD [facility] [phone]

## 2022-05-24 IMAGING — DX DG HIP (WITH OR WITHOUT PELVIS) 2-3V*R*
2 series · 2 of 2 positions shown · non-contrast
Comparison: None.

CLINICAL DATA: Chronic bilateral hip pain

EXAM:
DG HIP (WITH OR WITHOUT PELVIS) 2-3V RIGHT; DG HIP (WITH OR WITHOUT
PELVIS) 2-3V LEFT

[pelvis ap]
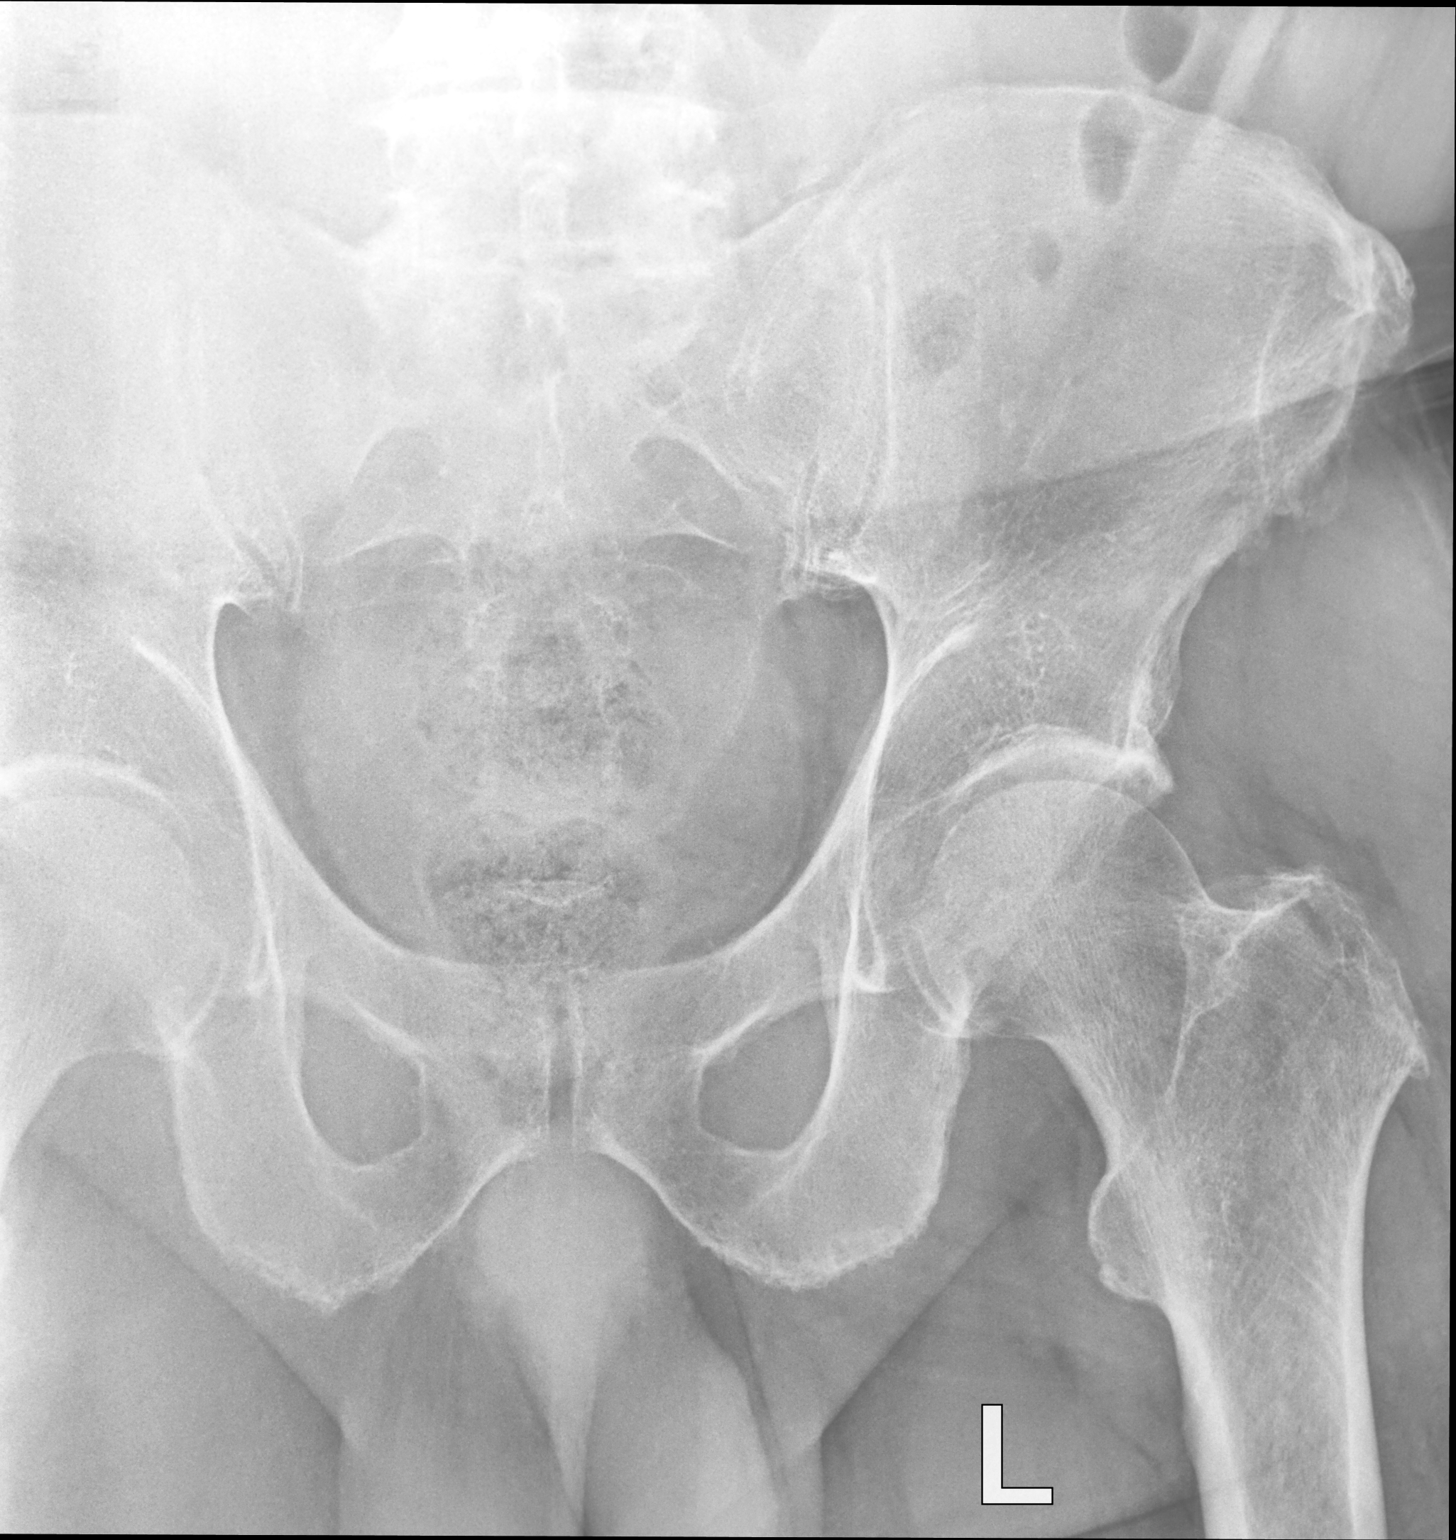

[hip joint [person_name] projection]
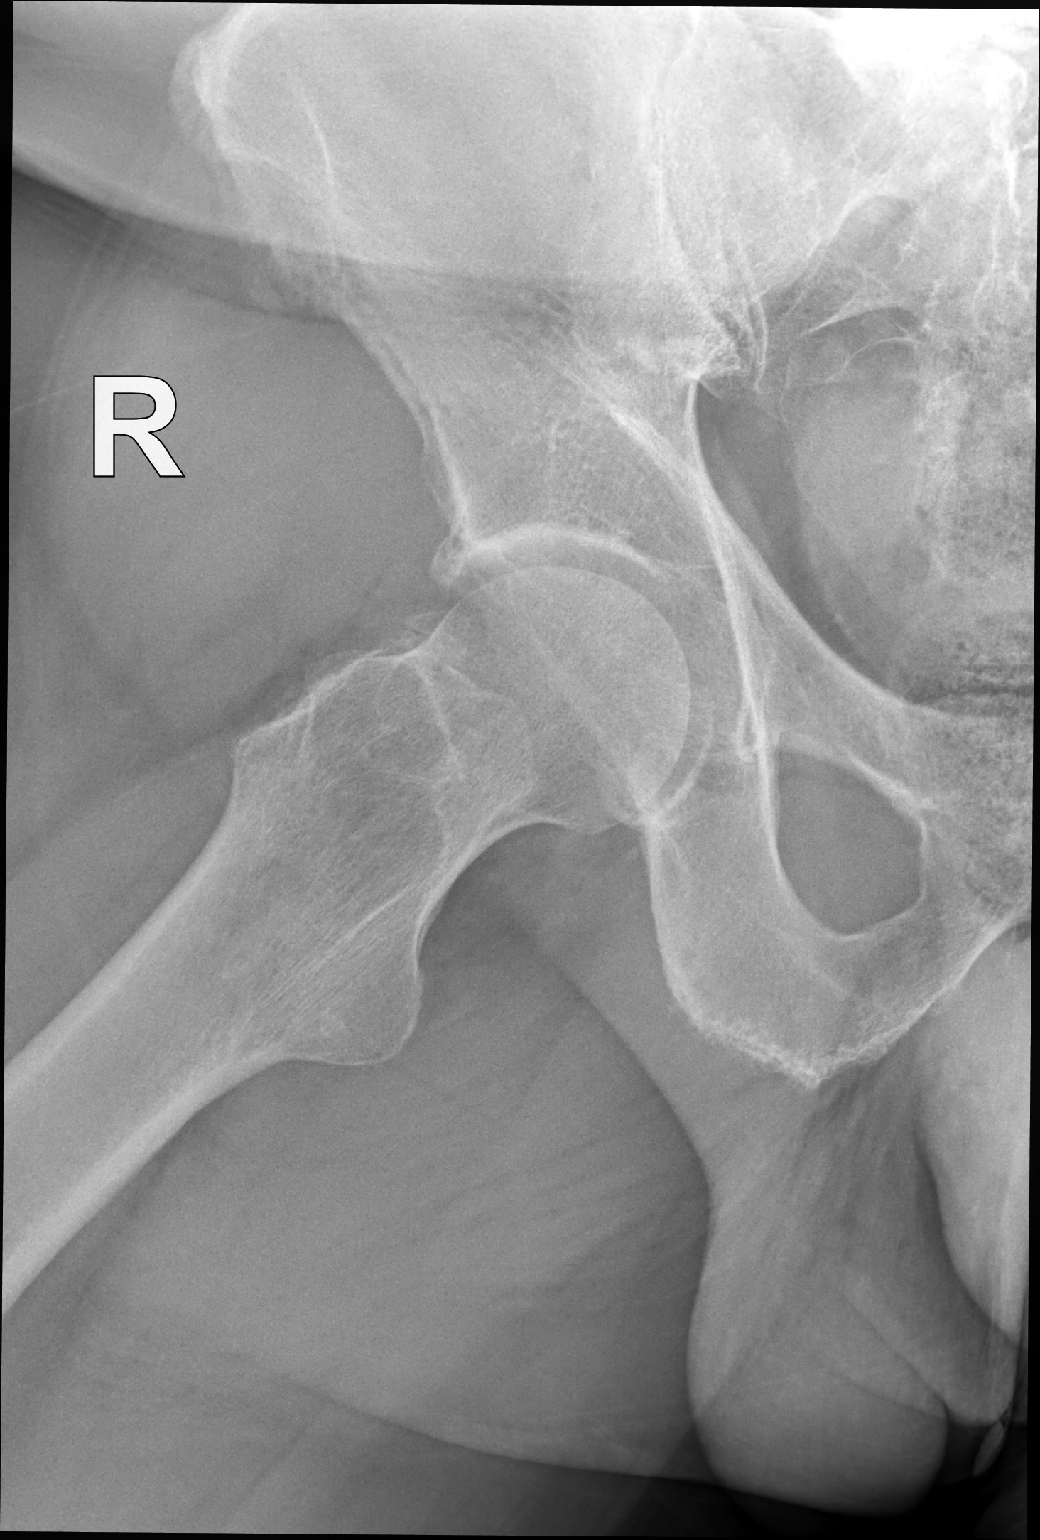

[2 of 2 positions shown; findings below may reference images not displayed]

FINDINGS: No fracture or dislocation of the bilateral hips. There is mild,
symmetric femoroacetabular arthrosis, primarily featuring acetabular
osteophytosis with little superior joint space loss. The included
bony pelvis is unremarkable.
IMPRESSION: Mild, symmetric bilateral femoroacetabular arthrosis, primarily
featuring acetabular osteophytosis.

## 2022-05-24 IMAGING — DX DG HIP (WITH OR WITHOUT PELVIS) 2-3V*L*
3 series · 3 of 3 positions shown · non-contrast
Comparison: None.

CLINICAL DATA: Chronic bilateral hip pain

EXAM:
DG HIP (WITH OR WITHOUT PELVIS) 2-3V RIGHT; DG HIP (WITH OR WITHOUT
PELVIS) 2-3V LEFT

[pelvis ap]
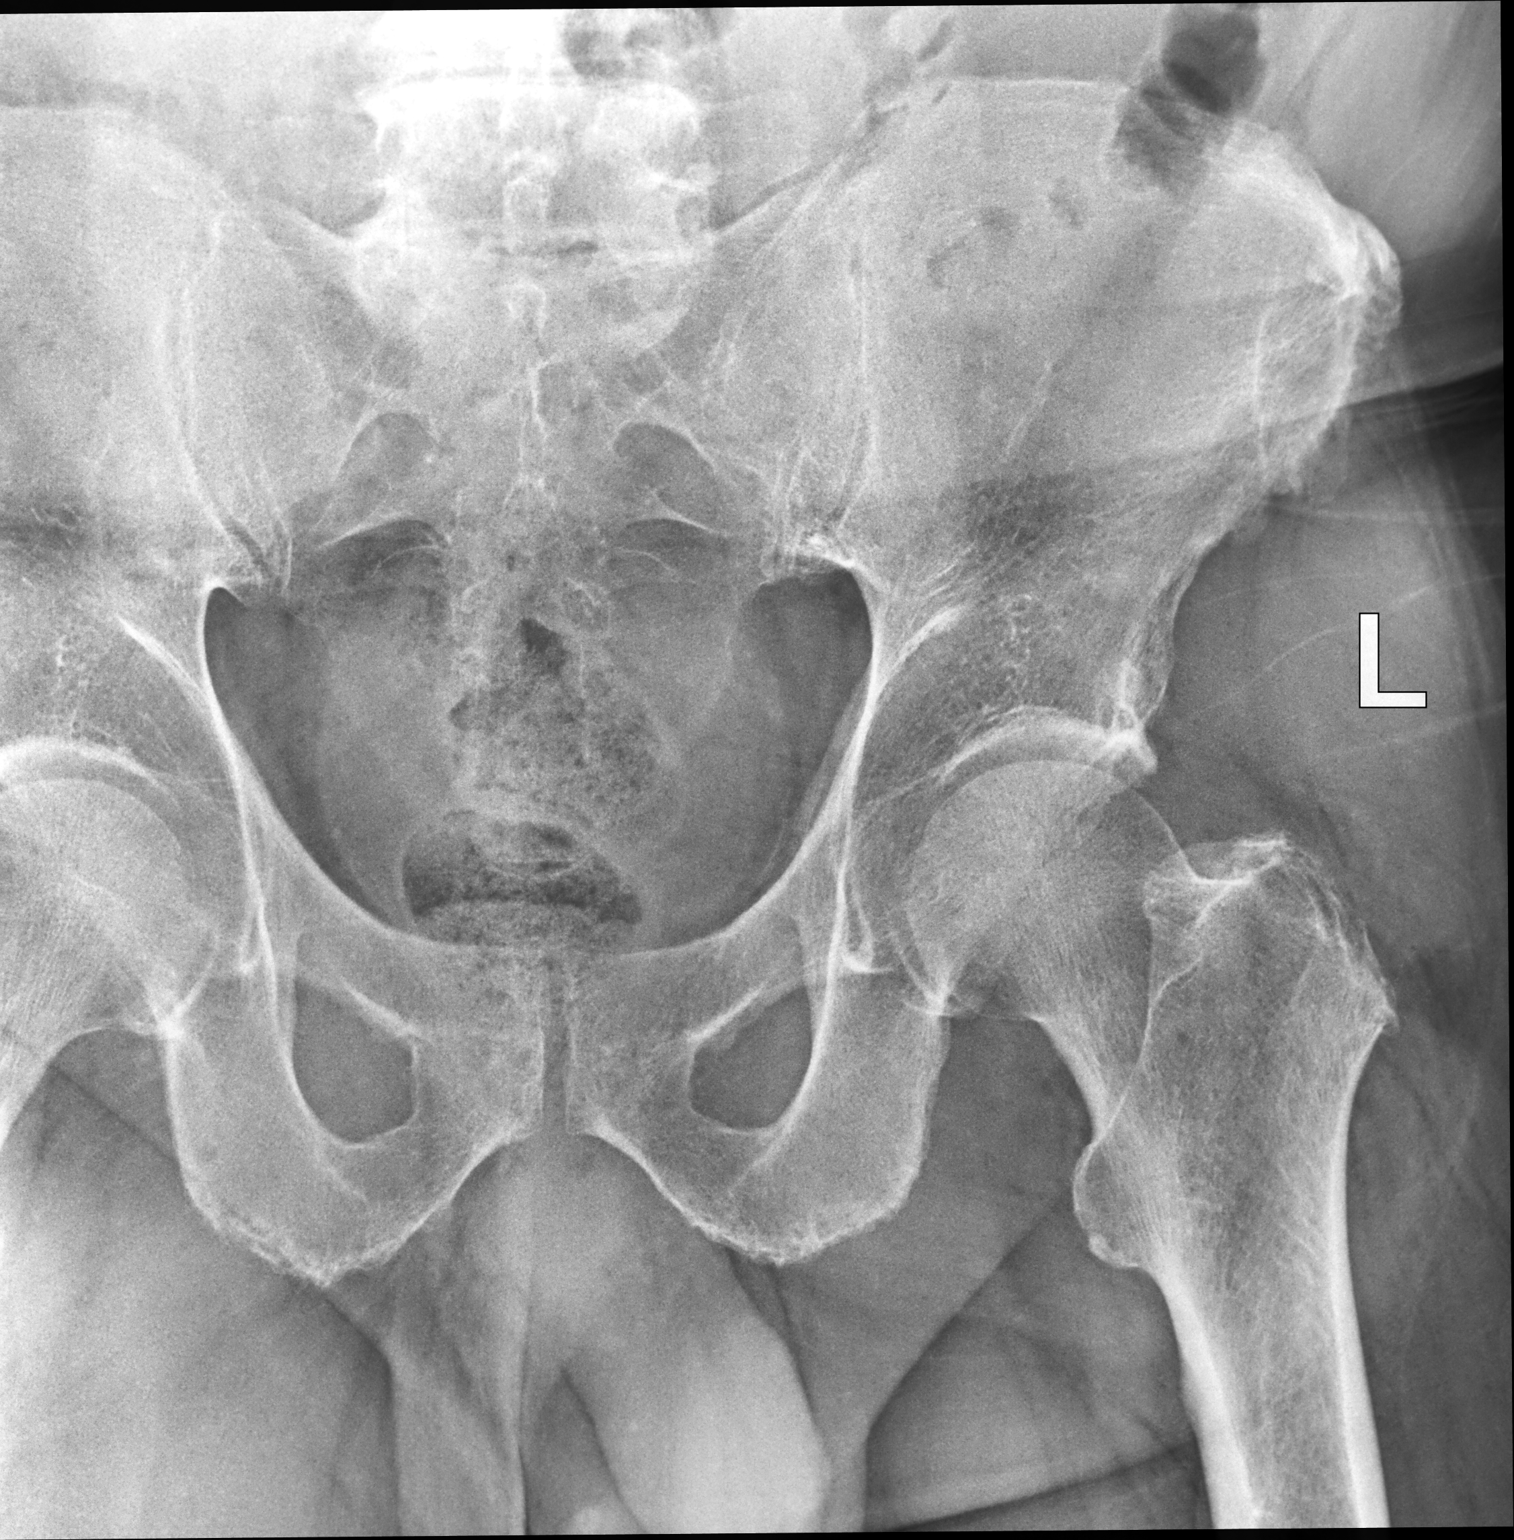

[hip joint ap]
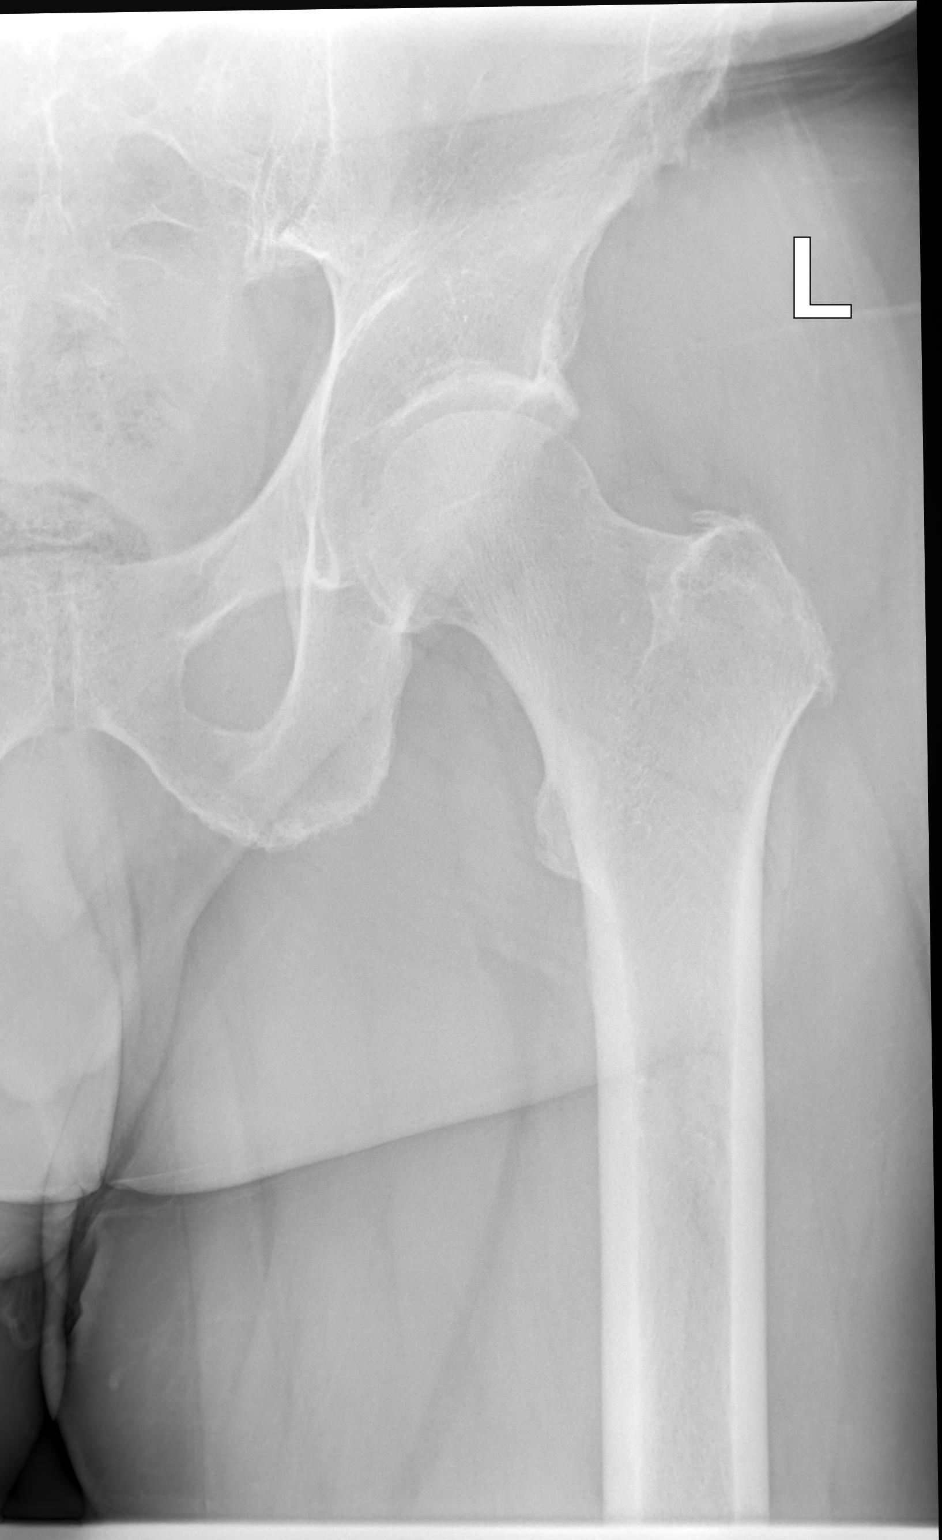

[hip joint [person_name] projection]
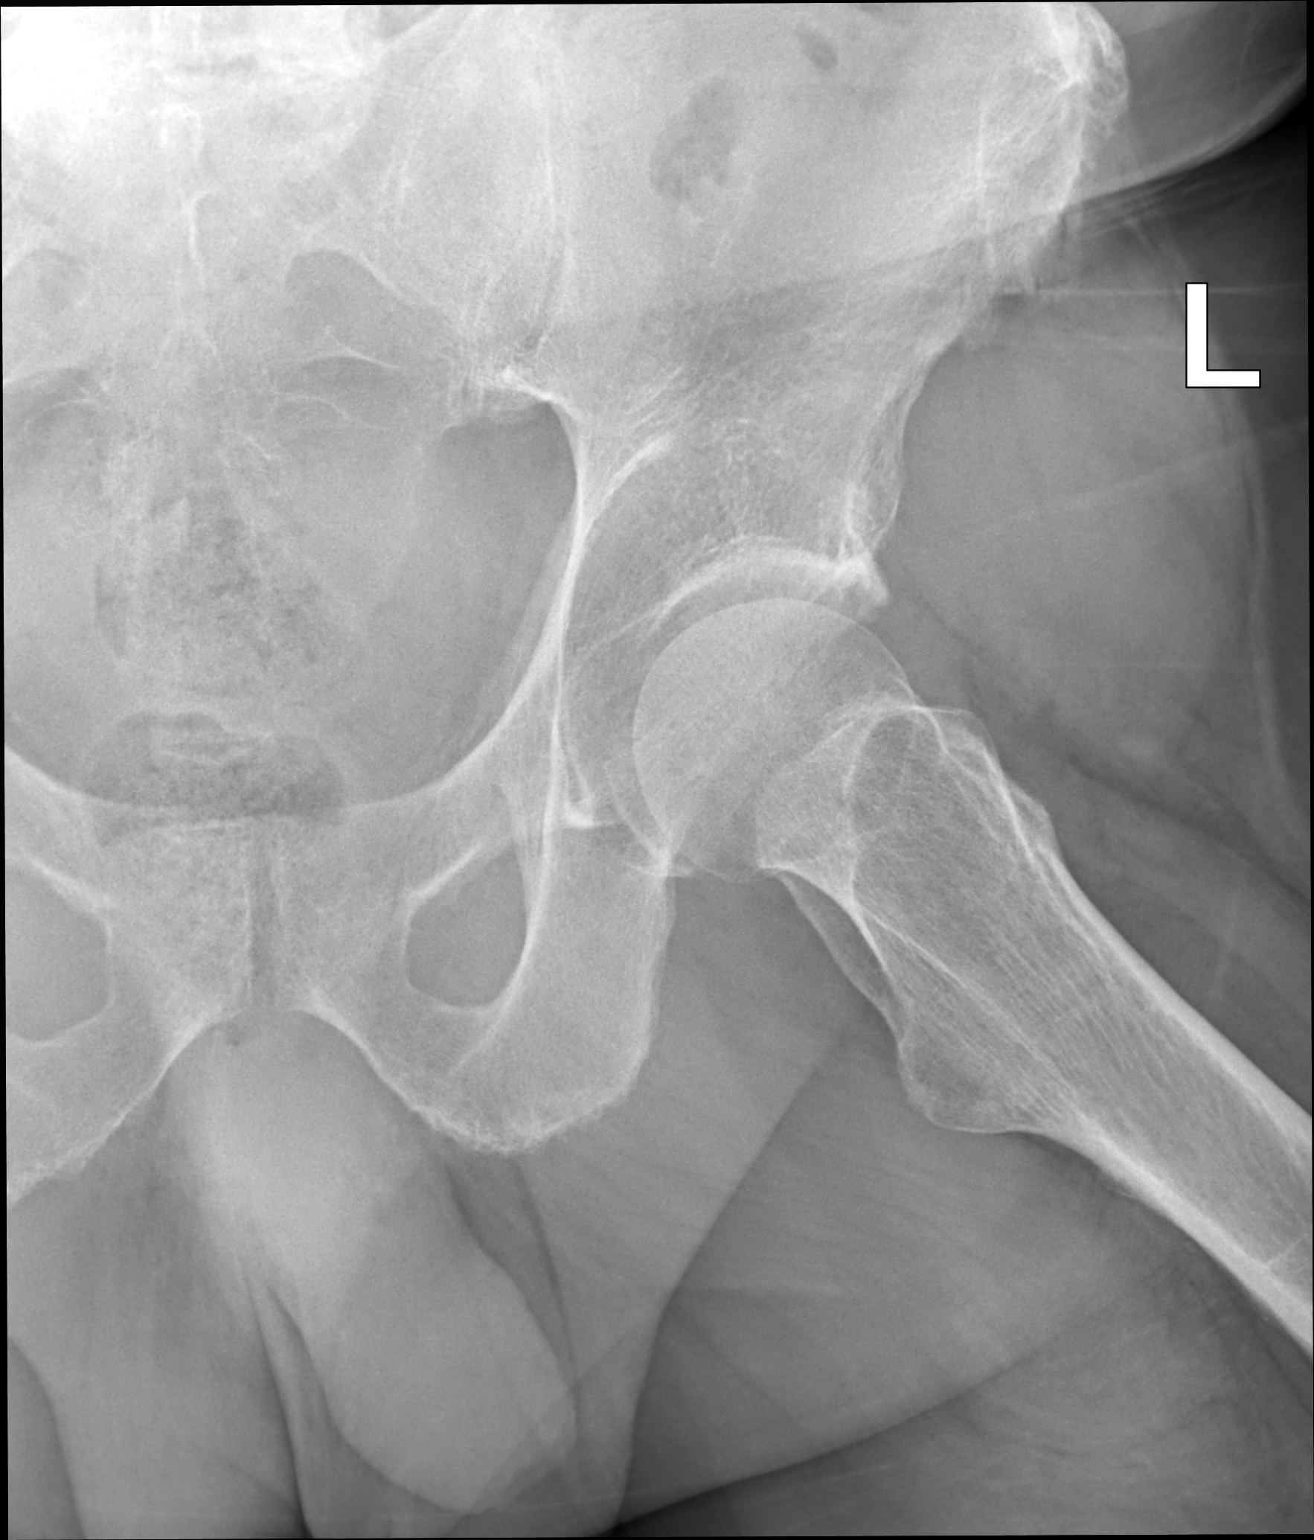

[3 of 3 positions shown; findings below may reference images not displayed]

FINDINGS: No fracture or dislocation of the bilateral hips. There is mild,
symmetric femoroacetabular arthrosis, primarily featuring acetabular
osteophytosis with little superior joint space loss. The included
bony pelvis is unremarkable.
IMPRESSION: Mild, symmetric bilateral femoroacetabular arthrosis, primarily
featuring acetabular osteophytosis.

## 2022-06-30 ENCOUNTER — Encounter: Payer: Medicare Other | Admitting: Family Medicine

## 2022-07-16 ENCOUNTER — Encounter: Payer: Medicare Other | Admitting: Family Medicine

## 2022-07-19 ENCOUNTER — Encounter: Payer: Self-pay | Admitting: Family Medicine

## 2022-07-19 ENCOUNTER — Ambulatory Visit (INDEPENDENT_AMBULATORY_CARE_PROVIDER_SITE_OTHER): Payer: Medicare Other | Admitting: Family Medicine

## 2022-07-19 VITALS — BP 136/85 | HR 97 | Temp 98.1°F | Resp 16

## 2022-07-19 DIAGNOSIS — Z13220 Encounter for screening for lipoid disorders: Secondary | ICD-10-CM | POA: Diagnosis not present

## 2022-07-19 DIAGNOSIS — Z125 Encounter for screening for malignant neoplasm of prostate: Secondary | ICD-10-CM | POA: Diagnosis not present

## 2022-07-19 DIAGNOSIS — Z0000 Encounter for general adult medical examination without abnormal findings: Secondary | ICD-10-CM | POA: Diagnosis not present

## 2022-07-19 DIAGNOSIS — Z130 Encounter for screening for diseases of the blood and blood-forming organs and certain disorders involving the immune mechanism: Secondary | ICD-10-CM | POA: Diagnosis not present

## 2022-07-19 MED ORDER — OMEPRAZOLE 40 MG PO CPDR: 40.0000 mg | DELAYED_RELEASE_CAPSULE | Freq: Every day | ORAL | 3 refills | Status: DC

## 2022-07-19 NOTE — Progress Notes (Unsigned)
New Patient Office Visit  Subjective    Patient ID: Johnny Brooks, male    DOB: December 07, 1955  Age: 67 y.o. MRN: [id number]  CC: No chief complaint on file.   HPI FERGUSON GERTNER presents to establish care ***  Outpatient Encounter Medications as of 07/19/2022  Medication Sig   CLARITIN 10 MG tablet    sildenafil (VIAGRA) 100 MG tablet Take 1 tablet (100 mg total) by mouth daily as needed for erectile dysfunction.   ZYRTEC ALLERGY 10 MG tablet    [DISCONTINUED] omeprazole (PRILOSEC) 40 MG capsule Take 1 capsule (40 mg total) by mouth daily.   omeprazole (PRILOSEC) 40 MG capsule Take 1 capsule (40 mg total) by mouth daily.   No facility-administered encounter medications on file as of 07/19/2022.    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  Social History   Socioeconomic History   Marital status: Widowed    Spouse name: Not on file   Number of children: Not on file   Years of education: Not on file   Highest education level: Not on file  Occupational History   Not on file  Tobacco Use   Smoking status: Former    Types: Cigars   Smokeless tobacco: Never  Vaping Use   Vaping Use: Never used  Substance and Sexual Activity   Alcohol use: Yes    Alcohol/week: 3.0 standard drinks of alcohol    Types: 3 Cans of beer per week   Drug use: No   Sexual activity: Not on file  Other Topics Concern   Not on file  Social History Narrative   Not on file   Social Determinants of Health   Financial Resource Strain: Low Risk  (07/19/2022)    Overall Financial Resource Strain (CARDIA)    Difficulty of Paying Living Expenses: Not very hard  Food Insecurity: No Food Insecurity (02/12/2022)   Hunger Vital Sign    Worried About Running Out of Food in the Last Year: Never true    Ran Out of Food in the Last Year: Never true  Transportation Needs: No Transportation Needs (02/12/2022)   PRAPARE - Administrator, Civil Service (Medical): No    Lack of Transportation (Non-Medical): No  Physical Activity: Inactive (07/19/2022)   Exercise Vital Sign    Days of Exercise per Week: 0 days    Minutes of Exercise per Session: 0 min  Stress: No Stress Concern Present (07/19/2022)   [employer] of Occupational Health - Occupational Stress Questionnaire    Feeling of Stress : Not at all  Social Connections: Socially Integrated (07/19/2022)   Social Connection and Isolation Panel [NHANES]    Frequency of Communication with Friends and Family: Three times a week    Frequency of Social Gatherings with Friends and Family: Three times a week    Attends Religious Services: More than 4 times per year  Active Member of Clubs or Organizations: No    Attends Banker Meetings: More than 4 times per year    Marital Status: Married  Catering manager Violence: Not At Risk (02/12/2022)   Humiliation, Afraid, Rape, and Kick questionnaire    Fear of Current or Ex-Partner: No    Emotionally Abused: No    Physically Abused: No    Sexually Abused: No    ROS      Objective    BP 136/85   Pulse 97   Temp 98.1 F (36.7 C) (Oral)   Resp 16   SpO2 94%   Physical Exam  {Labs (Optional):23779}    Assessment & Plan:   Problem List Items Addressed This Visit   None Visit Diagnoses     Annual physical exam    -  Primary   Relevant Orders   CMP14+EGFR   Screening for lipid disorders       Relevant Orders   Lipid Panel   Screening for deficiency anemia       Relevant Orders   CBC with Differential   Screening  for prostate cancer       Relevant Orders   PSA       No follow-ups on file.   Tommie Raymond, MD

## 2022-07-19 NOTE — Progress Notes (Unsigned)
Patient is here for complete physical examination - patient has no other concerns today

## 2022-07-20 LAB — CBC WITH DIFFERENTIAL/PLATELET
Basophils Absolute: 0 10*3/uL (ref 0.0–0.2)
Basos: 1 %
EOS (ABSOLUTE): 0.1 10*3/uL (ref 0.0–0.4)
Eos: 2 %
Hematocrit: 42 % (ref 37.5–51.0)
Hemoglobin: 14.6 g/dL (ref 13.0–17.7)
Immature Grans (Abs): 0 10*3/uL (ref 0.0–0.1)
Immature Granulocytes: 0 %
Lymphocytes Absolute: 1.9 10*3/uL (ref 0.7–3.1)
Lymphs: 36 %
MCH: 34.1 pg — ABNORMAL HIGH (ref 26.6–33.0)
MCHC: 34.8 g/dL (ref 31.5–35.7)
MCV: 98 fL — ABNORMAL HIGH (ref 79–97)
Monocytes Absolute: 0.4 10*3/uL (ref 0.1–0.9)
Monocytes: 8 %
Neutrophils Absolute: 2.8 10*3/uL (ref 1.4–7.0)
Neutrophils: 53 %
Platelets: 231 10*3/uL (ref 150–450)
RBC: 4.28 x10E6/uL (ref 4.14–5.80)
RDW: 12.4 % (ref 11.6–15.4)
WBC: 5.2 10*3/uL (ref 3.4–10.8)

## 2022-07-20 LAB — CMP14+EGFR
ALT: 55 IU/L — ABNORMAL HIGH (ref 0–44)
AST: 43 IU/L — ABNORMAL HIGH (ref 0–40)
Albumin/Globulin Ratio: 1.9 (ref 1.2–2.2)
Albumin: 4.3 g/dL (ref 3.9–4.9)
Alkaline Phosphatase: 61 IU/L (ref 44–121)
BUN/Creatinine Ratio: 14 (ref 10–24)
BUN: 13 mg/dL (ref 8–27)
Bilirubin Total: 0.4 mg/dL (ref 0.0–1.2)
CO2: 23 mmol/L (ref 20–29)
Calcium: 9.4 mg/dL (ref 8.6–10.2)
Chloride: 102 mmol/L (ref 96–106)
Creatinine, Ser: 0.95 mg/dL (ref 0.76–1.27)
Globulin, Total: 2.3 g/dL (ref 1.5–4.5)
Glucose: 120 mg/dL — ABNORMAL HIGH (ref 70–99)
Potassium: 4.4 mmol/L (ref 3.5–5.2)
Sodium: 141 mmol/L (ref 134–144)
Total Protein: 6.6 g/dL (ref 6.0–8.5)
eGFR: 87 mL/min/{1.73_m2} (ref >59)

## 2022-07-20 LAB — LIPID PANEL
Chol/HDL Ratio: 2.1 ratio (ref 0.0–5.0)
Cholesterol, Total: 151 mg/dL (ref 100–199)
HDL: 73 mg/dL (ref >39)
LDL Chol Calc (NIH): 60 mg/dL (ref 0–99)
Triglycerides: 98 mg/dL (ref 0–149)
VLDL Cholesterol Cal: 18 mg/dL (ref 5–40)

## 2022-07-20 LAB — PSA: Prostate Specific Ag, Serum: 0.6 ng/mL (ref 0.0–4.0)

## 2022-07-21 ENCOUNTER — Encounter: Payer: Self-pay | Admitting: Family Medicine

## 2022-07-21 DIAGNOSIS — L821 Other seborrheic keratosis: Secondary | ICD-10-CM | POA: Diagnosis not present

## 2022-07-21 DIAGNOSIS — L570 Actinic keratosis: Secondary | ICD-10-CM | POA: Diagnosis not present

## 2022-07-21 DIAGNOSIS — L812 Freckles: Secondary | ICD-10-CM | POA: Diagnosis not present

## 2022-07-21 DIAGNOSIS — D1801 Hemangioma of skin and subcutaneous tissue: Secondary | ICD-10-CM | POA: Diagnosis not present

## 2022-07-21 DIAGNOSIS — D225 Melanocytic nevi of trunk: Secondary | ICD-10-CM | POA: Diagnosis not present

## 2023-01-18 ENCOUNTER — Ambulatory Visit: Payer: Medicare Other | Admitting: Family Medicine

## 2023-01-25 ENCOUNTER — Ambulatory Visit: Payer: Medicare Other | Admitting: Family Medicine

## 2023-01-25 ENCOUNTER — Encounter: Payer: Self-pay | Admitting: Family Medicine

## 2023-01-25 VITALS — BP 136/92 | HR 95 | Temp 98.1°F | Resp 16 | Wt 272.4 lb

## 2023-01-25 DIAGNOSIS — K219 Gastro-esophageal reflux disease without esophagitis: Secondary | ICD-10-CM | POA: Diagnosis not present

## 2023-01-25 DIAGNOSIS — N529 Male erectile dysfunction, unspecified: Secondary | ICD-10-CM

## 2023-01-25 DIAGNOSIS — Z23 Encounter for immunization: Secondary | ICD-10-CM | POA: Diagnosis not present

## 2023-01-25 DIAGNOSIS — M25552 Pain in left hip: Secondary | ICD-10-CM

## 2023-01-25 DIAGNOSIS — R030 Elevated blood-pressure reading, without diagnosis of hypertension: Secondary | ICD-10-CM

## 2023-01-25 MED ORDER — OMEPRAZOLE 40 MG PO CPDR: 40.0000 mg | DELAYED_RELEASE_CAPSULE | Freq: Every day | ORAL | 3 refills | Status: DC

## 2023-01-25 MED ORDER — SILDENAFIL CITRATE 100 MG PO TABS: 100.0000 mg | ORAL_TABLET | Freq: Every day | ORAL | 3 refills | Status: DC | PRN

## 2023-01-26 ENCOUNTER — Encounter: Payer: Self-pay | Admitting: Family Medicine

## 2023-01-26 DIAGNOSIS — M25551 Pain in right hip: Secondary | ICD-10-CM | POA: Diagnosis not present

## 2023-01-26 DIAGNOSIS — M545 Low back pain, unspecified: Secondary | ICD-10-CM | POA: Diagnosis not present

## 2023-01-26 DIAGNOSIS — M25552 Pain in left hip: Secondary | ICD-10-CM | POA: Diagnosis not present

## 2023-01-26 NOTE — Progress Notes (Signed)
Established Patient Office Visit  Subjective    Patient ID: Johnny Brooks, male    DOB: 30-Dec-1955  Age: 67 y.o. MRN: [id number]  CC: No chief complaint on file.   HPI Johnny Brooks presents with complaint of left hip pain. Patient reports that it is becoming more difficult to do his daily activities. He denies known trauma or injury. Patient is also for follow up of chronic med issues including GERD and ED.   Outpatient Encounter Medications as of 01/25/2023  Medication Sig   CLARITIN 10 MG tablet    omeprazole (PRILOSEC) 40 MG capsule Take 1 capsule (40 mg total) by mouth daily.   sildenafil (VIAGRA) 100 MG tablet Take 1 tablet (100 mg total) by mouth daily as needed for erectile dysfunction.   ZYRTEC ALLERGY 10 MG tablet    [DISCONTINUED] omeprazole (PRILOSEC) 40 MG capsule Take 1 capsule (40 mg total) by mouth daily.   [DISCONTINUED] sildenafil (VIAGRA) 100 MG tablet Take 1 tablet (100 mg total) by mouth daily as needed for erectile dysfunction.   No facility-administered encounter medications on file as of 01/25/2023.    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  Social History   Socioeconomic History   Marital status: Widowed    Spouse name: Not on file   Number of children: Not on file   Years of education: Not on file   Highest education level: Not on file  Occupational History   Not on file  Tobacco Use   Smoking status: Former    Types: Cigars   Smokeless tobacco: Never  Vaping Use   Vaping status: Never Used   Substance and Sexual Activity   Alcohol use: Yes    Alcohol/week: 3.0 standard drinks of alcohol    Types: 3 Cans of beer per week   Drug use: No   Sexual activity: Not on file  Other Topics Concern   Not on file  Social History Narrative   Not on file   Social Determinants of Health   Financial Resource Strain: Low Risk  (07/19/2022)   Overall Financial Resource Strain (CARDIA)    Difficulty of Paying Living Expenses: Not very hard  Food Insecurity: No Food Insecurity (02/12/2022)   Hunger Vital Sign    Worried About Running Out of Food in the Last Year: Never true    Ran Out of Food in the Last Year: Never true  Transportation Needs: No Transportation Needs (02/12/2022)   PRAPARE - Administrator, Civil Service (Medical): No    Lack of Transportation (Non-Medical): No  Physical Activity: Inactive (07/19/2022)   Exercise Vital Sign    Days of Exercise per Week: 0 days    Minutes of Exercise per Session: 0 min  Stress: No Stress Concern Present (07/19/2022)   [employer] of Occupational Health - Occupational Stress Questionnaire    Feeling of Stress : Not at all  Social Connections: Socially Integrated (07/19/2022)   Social Connection and Isolation Panel [NHANES]    Frequency of Communication with Friends and Family: Three times a week    Frequency of Social Gatherings with Friends and Family: Three times a week    Attends Religious Services: More than 4 times per year    Active Member of Clubs or Organizations: No    Attends Banker Meetings: More than 4 times per year    Marital Status: Married  Catering manager Violence: Not At Risk (02/12/2022)   Humiliation, Afraid, Rape, and Kick questionnaire    Fear of Current or Ex-Partner: No    Emotionally Abused: No    Physically Abused: No    Sexually Abused: No    Review of Systems  All other systems reviewed and are negative.       Objective    BP (!) 136/92   Pulse 95   Temp 98.1 F  (36.7 C) (Oral)   Resp 16   Wt 272 lb 6.4 oz (123.6 kg)   SpO2 96%   BMI 36.94 kg/m   Physical Exam Vitals and nursing note reviewed.  Constitutional:      General: He is not in acute distress. Cardiovascular:     Rate and Rhythm: Normal rate and regular rhythm.  Pulmonary:     Effort: Pulmonary effort is normal.     Breath sounds: Normal breath sounds.  Abdominal:     Palpations: Abdomen is soft.     Tenderness: There is no abdominal tenderness.  Musculoskeletal:     Left hip: Tenderness present. No deformity. Decreased range of motion.  Neurological:     General: No focal deficit present.     Mental Status: He is alert and oriented to person, place, and time.         Assessment & Plan:   Gastroesophageal reflux disease without esophagitis  Erectile dysfunction, unspecified erectile dysfunction type -     Sildenafil Citrate; Take 1 tablet (100 mg total) by mouth daily as needed for erectile dysfunction.  Dispense: 30 tablet; Refill: 3  Left hip pain -     Ambulatory referral to Orthopedic Surgery  Elevated blood pressure reading in office without diagnosis of hypertension  Encounter for immunization -     Flu Vaccine Trivalent High Dose (Fluad)  Other orders -     Omeprazole; Take 1 capsule (40 mg total) by mouth daily.  Dispense: 90 capsule; Refill: 3     Return in about 6 months (around 07/26/2023) for follow up.   Tommie Raymond, MD

## 2023-02-23 DIAGNOSIS — M545 Low back pain, unspecified: Secondary | ICD-10-CM | POA: Diagnosis not present

## 2023-04-06 ENCOUNTER — Other Ambulatory Visit: Payer: Self-pay | Admitting: Family Medicine

## 2023-04-06 MED ORDER — OMEPRAZOLE 40 MG PO CPDR: 40.0000 mg | DELAYED_RELEASE_CAPSULE | Freq: Every day | ORAL | 3 refills | Status: DC

## 2023-04-06 NOTE — Telephone Encounter (Signed)
 Requested Prescriptions  Pending Prescriptions Disp Refills   omeprazole  (PRILOSEC) 40 MG capsule 90 capsule 3    Sig: Take 1 capsule (40 mg total) by mouth daily.     Gastroenterology: Proton Pump Inhibitors Passed - 04/06/2023  5:42 PM      Passed - Valid encounter within last 12 months    Recent Outpatient Visits           2 months ago Gastroesophageal reflux disease without esophagitis   [facility], MD   8 months ago Annual physical exam   [facility], MD   1 year ago Bilateral leg pain   [facility], MD   1 year ago Gastroesophageal reflux disease without esophagitis   [place] [facility], MD   1 year ago Annual physical exam   [facility] [facility], MD       Future Appointments             In 3 months Tanda Bleacher, MD [facility]

## 2023-04-06 NOTE — Telephone Encounter (Signed)
 Pt has never picked up his Rx omeprazole  (PRILOSEC) 40 MG capsule from [id number] - [place], [place] - [address]     Pt uses the  [facility] [address] [place], [place] - [address] AT [facility] [address]   Pt is asking if you can please transfer this Rx for him.

## 2023-07-21 ENCOUNTER — Ambulatory Visit: Payer: Medicare Other

## 2023-07-21 VITALS — Ht 72.0 in | Wt 272.0 lb

## 2023-07-21 DIAGNOSIS — Z0000 Encounter for general adult medical examination without abnormal findings: Secondary | ICD-10-CM | POA: Diagnosis not present

## 2023-07-21 NOTE — Patient Instructions (Addendum)
 Johnny Brooks , Thank you for taking time to come for your Medicare Wellness Visit. I appreciate your ongoing commitment to your health goals. Please review the following plan we discussed and let me know if I can assist you in the future.   Referrals/Orders/Follow-Ups/Clinician Recommendations: maintain health and activity  Aim for 30 minutes of exercise or brisk walking, 6-8 glasses of water, and 5 servings of fruits and vegetables each day.   This is a list of the screening recommended for you and due dates:  Health Maintenance  Topic Date Due   Colon Cancer Screening  Never done   Zoster (Shingles) Vaccine (1 of 2) Never done   Pneumonia Vaccine (2 of 2 - PPSV23) 01/17/2021   COVID-19 Vaccine (1 - 2024-25 season) Never done   Flu Shot  10/28/2023   Medicare Annual Wellness Visit  07/20/2024   Hepatitis C Screening  Completed   HPV Vaccine  Aged Out   Meningitis B Vaccine  Aged Out   DTaP/Tdap/Td vaccine  Discontinued    Advanced directives: (Copy Requested) Please bring a copy of your health care power of attorney and living will to the office to be added to your chart at your convenience. You can mail to [facility] [address] [place], [place] [address] or email to [email or web address]  Next Medicare Annual Wellness Visit scheduled for next year: Yes  Vaccinations: Influenza vaccine: recommend every Fall Pneumococcal vaccine: recommend once per lifetime Prevnar-20 Tdap vaccine: recommend every 10 years Shingles vaccine: recommend Shingrix which is 2 doses 2-6 months apart and over 90% effective     Covid-19: recommend 2 doses one month apart with a booster 6 months later

## 2023-07-21 NOTE — Progress Notes (Signed)
 Subjective:   Johnny Brooks is a 68 y.o. who presents for a Medicare Wellness preventive visit.  Visit Complete: Virtual I connected with  Johnny Brooks on 07/21/23 by a audio enabled telemedicine application and verified that I am speaking with the correct person using two identifiers.  Patient Location: Home  Provider Location: Office/Clinic  I discussed the limitations of evaluation and management by telemedicine. The patient expressed understanding and agreed to proceed.  Vital Signs: Because this visit was a virtual/telehealth visit, some criteria may be missing or patient reported. Any vitals not documented were not able to be obtained and vitals that have been documented are patient reported.  VideoDeclined- This patient declined Librarian, academic. Therefore the visit was completed with audio only.  Persons Participating in Visit: Patient.  AWV Questionnaire: No: Patient Medicare AWV questionnaire was not completed prior to this visit.  Cardiac Risk Factors include: advanced age (>50men, >63 women);obesity (BMI >30kg/m2);male gender     Objective:    Today's Vitals   07/21/23 0800  Weight: 272 lb (123.4 kg)  Height: 6' (1.829 m)   Body mass index is 36.89 kg/m.     07/21/2023    8:03 AM 10/26/2021   11:19 AM 02/27/2013    8:00 AM 02/19/2013   10:39 AM  Advanced Directives  Does Patient Have a Medical Advance Directive? Yes Yes Patient would not like information Patient would not like information  Type of Advance Directive Healthcare Power of [place];Living will Healthcare Power of [place];Living will    Copy of Healthcare Power of Attorney in Chart? No - copy requested     Pre-existing out of facility DNR order (yellow form or pink MOST form)   No     Current Medications (verified) Outpatient Encounter Medications as of 07/21/2023  Medication Sig   CLARITIN  10 MG tablet    omeprazole  (PRILOSEC) 40 MG capsule Take 1 capsule (40  mg total) by mouth daily.   sildenafil  (VIAGRA ) 100 MG tablet Take 1 tablet (100 mg total) by mouth daily as needed for erectile dysfunction.   ZYRTEC ALLERGY 10 MG tablet    No facility-administered encounter medications on file as of 07/21/2023.    Allergies (verified) Patient has no known allergies.   History: Past Medical History:  Diagnosis Date   Arthritis    knees   Left bundle branch block    Past Surgical History:  Procedure Laterality Date   ANKLE SURGERY     right   arthroscopies     numerous on knees   dislocated clavicle     right   KNEE SURGERY     left  1973   TOTAL KNEE ARTHROPLASTY Bilateral 02/26/2013   Procedure: TOTAL KNEE BILATERAL;  Surgeon: Ilean Mall, MD;  Location: [facility];  Service: Orthopedics;  Laterality: Bilateral;   History reviewed. No pertinent family history. Social History   Socioeconomic History   Marital status: Widowed    Spouse name: Not on file   Number of children: Not on file   Years of education: Not on file   Highest education level: Not on file  Occupational History   Not on file  Tobacco Use   Smoking status: Former    Types: Cigars   Smokeless tobacco: Never  Vaping Use   Vaping status: Never Used  Substance and Sexual Activity   Alcohol use: Yes    Alcohol/week: 3.0 standard drinks of alcohol    Types: 3 Cans of  beer per week   Drug use: No   Sexual activity: Not on file  Other Topics Concern   Not on file  Social History Narrative   Not on file   Social Drivers of Health   Financial Resource Strain: Patient Declined (07/21/2023)   Overall Financial Resource Strain (CARDIA)    Difficulty of Paying Living Expenses: Patient declined  Food Insecurity: Patient Declined (07/21/2023)   Hunger Vital Sign    Worried About Running Out of Food in the Last Year: Patient declined    Ran Out of Food in the Last Year: Patient declined  Transportation Needs: Patient Declined (07/21/2023)   PRAPARE - Therapist, art (Medical): Patient declined    Lack of Transportation (Non-Medical): Patient declined  Physical Activity: Inactive (07/21/2023)   Exercise Vital Sign    Days of Exercise per Week: 0 days    Minutes of Exercise per Session: 0 min  Stress: No Stress Concern Present (07/21/2023)   [employer] of Occupational Health - Occupational Stress Questionnaire    Feeling of Stress : Not at all  Social Connections: Patient Declined (07/21/2023)   Social Connection and Isolation Panel [NHANES]    Frequency of Communication with Friends and Family: Patient declined    Frequency of Social Gatherings with Friends and Family: Patient declined    Attends Religious Services: Patient declined    Database administrator or Organizations: Patient declined    Attends Engineer, structural: Patient declined    Marital Status: Patient declined    Tobacco Counseling Counseling given: Not Answered    Clinical Intake:  Pre-visit preparation completed: Yes  Pain : No/denies pain     BMI - recorded: 36.89 Nutritional Status: BMI > 30  Obese Nutritional Risks: None Diabetes: No  Lab Results  Component Value Date   HGBA1C 5.6 07/14/2021     How often do you need to have someone help you when you read instructions, pamphlets, or other written materials from your doctor or pharmacy?: 1 - Never  Interpreter Needed?: No  Information entered by :: Lamont Pilsner, LPN   Activities of Daily Living     07/21/2023    8:01 AM  In your present state of health, do you have any difficulty performing the following activities:  Hearing? 0  Vision? 0  Difficulty concentrating or making decisions? 0  Walking or climbing stairs? 0  Dressing or bathing? 0  Doing errands, shopping? 0  Preparing Food and eating ? N  Using the Toilet? N  In the past six months, have you accidently leaked urine? N  Do you have problems with loss of bowel control? N  Managing your Medications? N   Managing your Finances? N  Housekeeping or managing your Housekeeping? N    Patient Care Team: Abraham Abo, MD as PCP - General (Family Medicine)  Indicate any recent Medical Services you may have received from other than [facility] providers in the past year (date may be approximate).     Assessment:   This is a routine wellness examination for Johnny Brooks.  Hearing/Vision screen Hearing Screening - Comments:: Pt denies any hearing issues  Vision Screening - Comments:: Wears rx glasses - up to date with routine eye exams with unsure of providers name near [facility]     Goals Addressed             This Visit's Progress    Patient Stated       Maintain  health and activity        Depression Screen     07/21/2023    8:04 AM 01/25/2023   11:16 AM 09/23/2021    9:38 AM 07/14/2021    9:25 AM 01/20/2021    3:16 PM 06/06/2019    1:57 PM  PHQ 2/9 Scores  PHQ - 2 Score 0 0 0 0 0 0  PHQ- 9 Score   0 1 0     Fall Risk     07/21/2023    8:06 AM 01/25/2023   11:15 AM 01/20/2021    3:14 PM 06/06/2019    1:50 PM  Fall Risk   Falls in the past year? 0 0 0 0  Number falls in past yr: 0 0  0  Injury with Fall? 0 0  0  Risk for fall due to : No Fall Risks No Fall Risks    Follow up Falls prevention discussed       MEDICARE RISK AT HOME:  Medicare Risk at Home Any stairs in or around the home?: Yes If so, are there any without handrails?: No Home free of loose throw rugs in walkways, pet beds, electrical cords, etc?: Yes Adequate lighting in your home to reduce risk of falls?: Yes Life alert?: No Use of a cane, walker or w/c?: No Grab bars in the bathroom?: No Shower chair or bench in shower?: No Elevated toilet seat or a handicapped toilet?: No  TIMED UP AND GO:  Was the test performed?  No  Cognitive Function: 6CIT completed        07/21/2023    8:07 AM  6CIT Screen  What Year? 0 points  What month? 0 points  What time? 0 points  Count back from 20 0 points  Months  in reverse 0 points  Repeat phrase 0 points  Total Score 0 points    Immunizations Immunization History  Administered Date(s) Administered   Fluad Quad(high Dose 65+) 03/01/2022   Fluad Trivalent(High Dose 65+) 01/25/2023   Influenza,inj,Quad PF,6+ Mos 01/18/2020, 01/20/2021   Pneumococcal Conjugate-13 01/18/2020    Screening Tests Health Maintenance  Topic Date Due   Colonoscopy  Never done   Zoster Vaccines- Shingrix (1 of 2) Never done   Pneumonia Vaccine 58+ Years old (2 of 2 - PPSV23) 01/17/2021   COVID-19 Vaccine (1 - 2024-25 season) Never done   INFLUENZA VACCINE  10/28/2023   Medicare Annual Wellness (AWV)  07/20/2024   Hepatitis C Screening  Completed   HPV VACCINES  Aged Out   Meningococcal B Vaccine  Aged Out   DTaP/Tdap/Td  Discontinued    Health Maintenance  Health Maintenance Due  Topic Date Due   Colonoscopy  Never done   Zoster Vaccines- Shingrix (1 of 2) Never done   Pneumonia Vaccine 78+ Years old (2 of 2 - PPSV23) 01/17/2021   COVID-19 Vaccine (1 - 2024-25 season) Never done   Health Maintenance Items Addressed: See Nurse Notes  Additional Screening:  Vision Screening: Recommended annual ophthalmology exams for early detection of glaucoma and other disorders of the eye.  Dental Screening: Recommended annual dental exams for proper oral hygiene  Community Resource Referral / Chronic Care Management: CRR required this visit?  No   CCM required this visit?  No     Plan:     I have personally reviewed and noted the following in the patient's chart:   Medical and social history Use of alcohol, tobacco or illicit drugs  Current medications  and supplements including opioid prescriptions. Patient is not currently taking opioid prescriptions. Functional ability and status Nutritional status Physical activity Advanced directives List of other physicians Hospitalizations, surgeries, and ER visits in previous 12 months Vitals Screenings to  include cognitive, depression, and falls Referrals and appointments  In addition, I have reviewed and discussed with patient certain preventive protocols, quality metrics, and best practice recommendations. A written personalized care plan for preventive services as well as general preventive health recommendations were provided to patient.     Johnny Capri, LPN   9/60/4540   After Visit Summary: (MyChart) Due to this being a telephonic visit, the after visit summary with patients personalized plan was offered to patient via MyChart   Notes: Nothing significant to report at this time.

## 2023-07-26 ENCOUNTER — Ambulatory Visit (INDEPENDENT_AMBULATORY_CARE_PROVIDER_SITE_OTHER): Payer: Medicare Other | Admitting: Family Medicine

## 2023-07-26 ENCOUNTER — Encounter: Payer: Self-pay | Admitting: Family Medicine

## 2023-07-26 VITALS — BP 128/83 | HR 83 | Ht 72.0 in | Wt 264.4 lb

## 2023-07-26 DIAGNOSIS — Z130 Encounter for screening for diseases of the blood and blood-forming organs and certain disorders involving the immune mechanism: Secondary | ICD-10-CM | POA: Diagnosis not present

## 2023-07-26 DIAGNOSIS — N529 Male erectile dysfunction, unspecified: Secondary | ICD-10-CM

## 2023-07-26 DIAGNOSIS — Z1211 Encounter for screening for malignant neoplasm of colon: Secondary | ICD-10-CM

## 2023-07-26 DIAGNOSIS — Z0000 Encounter for general adult medical examination without abnormal findings: Secondary | ICD-10-CM | POA: Diagnosis not present

## 2023-07-26 DIAGNOSIS — Z13220 Encounter for screening for lipoid disorders: Secondary | ICD-10-CM

## 2023-07-26 MED ORDER — SILDENAFIL CITRATE 100 MG PO TABS: 100.0000 mg | ORAL_TABLET | Freq: Every day | ORAL | 3 refills | Status: AC | PRN

## 2023-07-26 NOTE — Progress Notes (Signed)
 Established Patient Office Visit  Subjective    Patient ID: Johnny Brooks, male    DOB: Jun 20, 1955  Age: 68 y.o. MRN: [id number]  CC:  Chief Complaint  Patient presents with   Annual Exam    HPI Johnny Brooks presents for routine annual exam. Patient denies acute complaints.   Outpatient Encounter Medications as of 07/26/2023  Medication Sig   CLARITIN  10 MG tablet    omeprazole  (PRILOSEC) 40 MG capsule Take 1 capsule (40 mg total) by mouth daily.   ZYRTEC ALLERGY 10 MG tablet    [DISCONTINUED] sildenafil  (VIAGRA ) 100 MG tablet Take 1 tablet (100 mg total) by mouth daily as needed for erectile dysfunction.   sildenafil  (VIAGRA ) 100 MG tablet Take 1 tablet (100 mg total) by mouth daily as needed for erectile dysfunction.   No facility-administered encounter medications on file as of 07/26/2023.    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  Social History   Socioeconomic History   Marital status: Widowed    Spouse name: Not on file   Number of children: Not on file   Years of education: Not on file   Highest education level: Not on file  Occupational History   Not on file  Tobacco Use   Smoking status: Former    Types: Cigars   Smokeless tobacco: Never  Vaping Use   Vaping status: Never Used  Substance and Sexual Activity   Alcohol use: Yes    Alcohol/week: 3.0 standard drinks of alcohol    Types: 3 Cans of beer per week   Drug use: No   Sexual activity: Not on file  Other Topics Concern   Not on file  Social History  Narrative   Not on file   Social Drivers of Health   Financial Resource Strain: Patient Declined (07/21/2023)   Overall Financial Resource Strain (CARDIA)    Difficulty of Paying Living Expenses: Patient declined  Food Insecurity: Patient Declined (07/21/2023)   Hunger Vital Sign    Worried About Running Out of Food in the Last Year: Patient declined    Ran Out of Food in the Last Year: Patient declined  Transportation Needs: Patient Declined (07/21/2023)   PRAPARE - Administrator, Civil Service (Medical): Patient declined    Lack of Transportation (Non-Medical): Patient declined  Physical Activity: Inactive (07/21/2023)   Exercise Vital Sign    Days of Exercise per Week: 0 days    Minutes of Exercise per Session: 0 min  Stress: No Stress Concern Present (07/21/2023)   [employer] of Occupational Health - Occupational Stress Questionnaire    Feeling of Stress : Not at all  Social Connections: Patient Declined (07/21/2023)   Social Connection and Isolation Panel [NHANES]    Frequency of Communication with Friends and Family: Patient declined    Frequency of Social Gatherings with Friends and Family: Patient declined    Attends  Religious Services: Patient declined    Active Member of Clubs or Organizations: Patient declined    Attends Banker Meetings: Patient declined    Marital Status: Patient declined  Intimate Partner Violence: Not At Risk (07/21/2023)   Humiliation, Afraid, Rape, and Kick questionnaire    Fear of Current or Ex-Partner: No    Emotionally Abused: No    Physically Abused: No    Sexually Abused: No    Review of Systems  All other systems reviewed and are negative.       Objective    BP 128/83 (BP Location: Right Arm, Patient Position: Sitting, Cuff Size: Large)   Pulse 83   Ht 6' (1.829 m)   Wt 264 lb 6.4 oz (119.9 kg)   SpO2 94%   BMI 35.86 kg/m   Physical Exam Vitals and nursing note reviewed.  Constitutional:       General: He is not in acute distress. HENT:     Head: Normocephalic and atraumatic.     Right Ear: Tympanic membrane, ear canal and external ear normal.     Left Ear: Tympanic membrane, ear canal and external ear normal.     Nose: Nose normal.     Mouth/Throat:     Mouth: Mucous membranes are moist.     Pharynx: Oropharynx is clear.  Eyes:     Conjunctiva/sclera: Conjunctivae normal.     Pupils: Pupils are equal, round, and reactive to light.  Neck:     Thyroid: No thyromegaly.  Cardiovascular:     Rate and Rhythm: Normal rate and regular rhythm.     Heart sounds: Normal heart sounds. No murmur heard. Pulmonary:     Effort: Pulmonary effort is normal.     Breath sounds: Normal breath sounds.  Abdominal:     General: There is no distension.     Palpations: Abdomen is soft. There is no mass.     Tenderness: There is no abdominal tenderness.     Hernia: There is no hernia in the left inguinal area or right inguinal area.  Musculoskeletal:        General: Normal range of motion.     Cervical back: Normal range of motion and neck supple.     Right lower leg: No edema.     Left lower leg: No edema.  Skin:    General: Skin is warm and dry.  Neurological:     General: No focal deficit present.     Mental Status: He is alert and oriented to person, place, and time. Mental status is at baseline.  Psychiatric:        Mood and Affect: Mood normal.        Behavior: Behavior normal.         Assessment & Plan:   Annual physical exam -     CMP14+EGFR  Screening for deficiency anemia -     CBC with Differential/Platelet  Screening for lipid disorders -     Lipid panel  Screening for colon cancer -     Cologuard  Erectile dysfunction, unspecified erectile dysfunction type -     Sildenafil  Citrate; Take 1 tablet (100 mg total) by mouth daily as needed for erectile dysfunction.  Dispense: 30 tablet; Refill: 3     Return in about 1 year (around 07/25/2024) for physical.    Arlo Lama, MD

## 2023-07-27 LAB — CMP14+EGFR
ALT: 52 IU/L — ABNORMAL HIGH (ref 0–44)
AST: 43 IU/L — ABNORMAL HIGH (ref 0–40)
Albumin: 4.6 g/dL (ref 3.9–4.9)
Alkaline Phosphatase: 62 IU/L (ref 44–121)
BUN/Creatinine Ratio: 8 — ABNORMAL LOW (ref 10–24)
BUN: 7 mg/dL — ABNORMAL LOW (ref 8–27)
Bilirubin Total: 0.4 mg/dL (ref 0.0–1.2)
CO2: 17 mmol/L — ABNORMAL LOW (ref 20–29)
Calcium: 9.7 mg/dL (ref 8.6–10.2)
Chloride: 102 mmol/L (ref 96–106)
Creatinine, Ser: 0.89 mg/dL (ref 0.76–1.27)
Globulin, Total: 2.4 g/dL (ref 1.5–4.5)
Glucose: 104 mg/dL — ABNORMAL HIGH (ref 70–99)
Potassium: 4.3 mmol/L (ref 3.5–5.2)
Sodium: 139 mmol/L (ref 134–144)
Total Protein: 7 g/dL (ref 6.0–8.5)
eGFR: 93 mL/min/{1.73_m2} (ref >59)

## 2023-07-27 LAB — CBC WITH DIFFERENTIAL/PLATELET
Basophils Absolute: 0.1 10*3/uL (ref 0.0–0.2)
Basos: 2 %
EOS (ABSOLUTE): 0.1 10*3/uL (ref 0.0–0.4)
Eos: 3 %
Hematocrit: 49 % (ref 37.5–51.0)
Hemoglobin: 16.7 g/dL (ref 13.0–17.7)
Immature Grans (Abs): 0 10*3/uL (ref 0.0–0.1)
Immature Granulocytes: 1 %
Lymphocytes Absolute: 1.4 10*3/uL (ref 0.7–3.1)
Lymphs: 35 %
MCH: 34.1 pg — ABNORMAL HIGH (ref 26.6–33.0)
MCHC: 34.1 g/dL (ref 31.5–35.7)
MCV: 100 fL — ABNORMAL HIGH (ref 79–97)
Monocytes Absolute: 0.4 10*3/uL (ref 0.1–0.9)
Monocytes: 9 %
Neutrophils Absolute: 2.1 10*3/uL (ref 1.4–7.0)
Neutrophils: 50 %
Platelets: 190 10*3/uL (ref 150–450)
RBC: 4.9 x10E6/uL (ref 4.14–5.80)
RDW: 12.5 % (ref 11.6–15.4)
WBC: 4.1 10*3/uL (ref 3.4–10.8)

## 2023-07-27 LAB — LIPID PANEL
Chol/HDL Ratio: 3.2 ratio (ref 0.0–5.0)
Cholesterol, Total: 164 mg/dL (ref 100–199)
HDL: 52 mg/dL (ref >39)
LDL Chol Calc (NIH): 87 mg/dL (ref 0–99)
Triglycerides: 146 mg/dL (ref 0–149)
VLDL Cholesterol Cal: 25 mg/dL (ref 5–40)

## 2023-08-09 DIAGNOSIS — D1801 Hemangioma of skin and subcutaneous tissue: Secondary | ICD-10-CM | POA: Diagnosis not present

## 2023-08-09 DIAGNOSIS — L218 Other seborrheic dermatitis: Secondary | ICD-10-CM | POA: Diagnosis not present

## 2023-08-09 DIAGNOSIS — B353 Tinea pedis: Secondary | ICD-10-CM | POA: Diagnosis not present

## 2023-08-09 DIAGNOSIS — L570 Actinic keratosis: Secondary | ICD-10-CM | POA: Diagnosis not present

## 2023-08-09 DIAGNOSIS — L821 Other seborrheic keratosis: Secondary | ICD-10-CM | POA: Diagnosis not present

## 2023-11-07 ENCOUNTER — Ambulatory Visit: Payer: Self-pay | Admitting: Family Medicine

## 2023-11-18 DIAGNOSIS — M545 Low back pain, unspecified: Secondary | ICD-10-CM | POA: Diagnosis not present

## 2024-01-10 NOTE — Progress Notes (Signed)
 EPIFANIO LABRADOR                                          MRN: [id number]   01/10/2024   The [facility] Quality Team Specialist reviewed this patient medical record for the purposes of chart review for care gap closure. The following were reviewed: chart review for care gap closure-colorectal cancer screening.    [facility] Quality Team

## 2024-04-06 ENCOUNTER — Ambulatory Visit: Payer: Self-pay

## 2024-04-06 ENCOUNTER — Other Ambulatory Visit: Payer: Self-pay | Admitting: Family Medicine

## 2024-04-06 NOTE — Telephone Encounter (Unsigned)
 Copied from CRM [id number]. Topic: Clinical - Medication Refill >> Apr 06, 2024 11:17 AM Gustabo D wrote: Medication:  omeprazole  (PRILOSEC) 40 MG capsule  Has the patient contacted their pharmacy? No (Agent: If no, request that the patient contact the pharmacy for the refill. If patient does not wish to contact the pharmacy document the reason why and proceed with request.) (Agent: If yes, when and what did the pharmacy advise?)  This is the patient's preferred pharmacy:  [id number] [place] - [place], [place] - [address] [place]. AT [address] [address] [facility] [address] [place]. [place] [address] Phone: [phone] Fax: [phone]  Is this the correct pharmacy for this prescription? Yes If no, delete pharmacy and type the correct one.   Has the prescription been filled recently? No  Is the patient out of the medication? Yes  Has the patient been seen for an appointment in the last year OR does the patient have an upcoming appointment? Yes  Can we respond through MyChart? Yes  Agent: Please be advised that Rx refills may take up to 3 business days. We ask that you follow-up with your pharmacy.

## 2024-04-06 NOTE — Telephone Encounter (Signed)
" °  FYI Only or Action Required?: FYI only for provider: appointment scheduled on 1/22.  Patient was last seen in primary care on 07/26/2023 by Tanda Bleacher, MD.  Called Nurse Triage reporting No chief complaint on file..  Symptoms began several weeks ago.  Interventions attempted: Nothing.  Symptoms are: stable.  Triage Disposition: No disposition on file.  Patient/caregiver understands and will follow disposition?:   Copied from CRM [id number]. Topic: Clinical - Red Word Triage >> Apr 06, 2024 11:17 AM Gustabo D wrote: Pt has been experiencing a little bit of incontinence and dull pain in ribs. For about a week. Reason for Disposition  [1] Can't control passage of urine (i.e., urinary incontinence, wetting self) AND [2] present > 2 weeks  Protocols used: Urinary Symptoms-A-AH  "

## 2024-04-06 NOTE — Telephone Encounter (Signed)
 Noted thanks

## 2024-04-09 MED ORDER — OMEPRAZOLE 40 MG PO CPDR: 40.0000 mg | DELAYED_RELEASE_CAPSULE | Freq: Every day | ORAL | 1 refills | Status: AC

## 2024-04-09 NOTE — Telephone Encounter (Signed)
 Requested Prescriptions  Pending Prescriptions Disp Refills   omeprazole  (PRILOSEC) 40 MG capsule 90 capsule 1    Sig: Take 1 capsule (40 mg total) by mouth daily.     Gastroenterology: Proton Pump Inhibitors Passed - 04/09/2024 11:12 AM      Passed - Valid encounter within last 12 months    Recent Outpatient Visits           8 months ago Annual physical exam   [facility], MD   1 year ago Gastroesophageal reflux disease without esophagitis   [facility], MD   1 year ago Annual physical exam   [facility], MD   2 years ago Bilateral leg pain   [facility], MD   2 years ago Gastroesophageal reflux disease without esophagitis    [facility] at [facility], MD

## 2024-04-19 ENCOUNTER — Ambulatory Visit (INDEPENDENT_AMBULATORY_CARE_PROVIDER_SITE_OTHER): Payer: Self-pay

## 2024-04-19 VITALS — BP 120/72 | HR 103 | Temp 98.7°F | Resp 16 | Wt 275.0 lb

## 2024-04-19 DIAGNOSIS — N3943 Post-void dribbling: Secondary | ICD-10-CM

## 2024-04-19 LAB — POCT URINALYSIS DIP (CLINITEK)
Bilirubin, UA: NEGATIVE
Blood, UA: NEGATIVE
Glucose, UA: NEGATIVE mg/dL
Leukocytes, UA: NEGATIVE
Nitrite, UA: NEGATIVE
POC PROTEIN,UA: NEGATIVE
Spec Grav, UA: >=1.03 — AB
Urobilinogen, UA: 1 U/dL
pH, UA: 5.5

## 2024-04-19 MED ORDER — TAMSULOSIN HCL 0.4 MG PO CAPS: 0.4000 mg | ORAL_CAPSULE | Freq: Every day | ORAL | 2 refills | Status: DC

## 2024-04-19 NOTE — Progress Notes (Signed)
" ° ° ° °  Patient ID: Johnny Brooks, male    DOB: 1956/03/23  MRN: [id number]  CC: Medical Management of Chronic Issues (Patient would like  a  referral to urologist for leakage)   Subjective: Johnny Brooks is a 69 y.o. male  who presents to clinic for urinary incontinence which began 3 weeks ago.  Patient reports that he is experiencing slow leakage of urine throughout the day.  Has noticed that he has a weaker stream during urination. Has noticed that he is going to the bathroom more frequently. Has not had changes to caffeine intake, drinks 2 cups of coffee in the morning.  Reports no changes to bowel habits.  Denies numbness/tingling in legs or groin.  Denies pain with urination, discharge or discomfort.   Allergies[1]  ROS: Review of Systems Negative except as stated above  PHYSICAL EXAM: BP 120/72   Pulse (!) 103   Temp 98.7 F (37.1 C) (Oral)   Resp 16   Wt 275 lb (124.7 kg)   SpO2 95%   BMI 37.30 kg/m   Physical Exam  General: well-appearing, no acute distress Skin: no jaundice, rashes, or lesions Cardiovascular: regular heart rate and rhythm, normal S1/S2 Abdomen: soft, non-distended, non-tender to palpation Musculoskeletal: normal gait Extremities: no peripheral edema  ASSESSMENT AND PLAN:  1. Post-void dribbling (Primary) - PSA test due to family history of prostate cancer - POCT URINALYSIS DIP (CLINITEK) negative for urinary tract infection. - Ambulatory referral to Urology for evaluation and post void volume check - Start tamsulosin  (FLOMAX ) 0.4 MG CAPS capsule; Take 1 capsule (0.4 mg total) by mouth daily.  Dispense: 30 capsule; Refill: 2   Patient was given the opportunity to ask questions.  Patient verbalized understanding of the plan and was able to repeat key elements of the plan.    Orders Placed This Encounter  Procedures   PSA   Ambulatory referral to Urology   POCT URINALYSIS DIP (CLINITEK)    Requested Prescriptions   Signed Prescriptions  Disp Refills   tamsulosin  (FLOMAX ) 0.4 MG CAPS capsule 30 capsule 2    Sig: Take 1 capsule (0.4 mg total) by mouth daily.    No follow-ups on file.  Johnny Cower Catarino Vold, PA-C      [1] No Known Allergies  "

## 2024-04-20 ENCOUNTER — Ambulatory Visit: Payer: Self-pay

## 2024-04-20 LAB — PSA: Prostate Specific Ag, Serum: 0.9 ng/mL (ref 0.0–4.0)

## 2024-04-20 NOTE — Progress Notes (Signed)
 PSA is 0.9 which is in the normal range.

## 2024-05-01 ENCOUNTER — Encounter: Payer: Self-pay | Admitting: Urology

## 2024-05-01 ENCOUNTER — Ambulatory Visit: Admitting: Urology

## 2024-05-01 VITALS — BP 150/80 | HR 119 | Ht 72.0 in | Wt 265.0 lb

## 2024-05-01 DIAGNOSIS — N3943 Post-void dribbling: Secondary | ICD-10-CM | POA: Diagnosis not present

## 2024-05-01 DIAGNOSIS — N401 Enlarged prostate with lower urinary tract symptoms: Secondary | ICD-10-CM

## 2024-05-01 LAB — BLADDER SCAN AMB NON-IMAGING: Scan Result: 18

## 2024-05-01 MED ORDER — TAMSULOSIN HCL 0.4 MG PO CAPS: 0.8000 mg | ORAL_CAPSULE | Freq: Every day | ORAL | 3 refills | Status: AC

## 2024-05-01 NOTE — Progress Notes (Signed)
 Patient presents for an office visit. BP today is High. Greater than 140/90. Provider  notified and recheck Blood Pressure 150 80.  Pt advised to talk with PCP.  Pt voiced understanding.

## 2024-05-29 ENCOUNTER — Other Ambulatory Visit: Admitting: Urology

## 2024-07-25 ENCOUNTER — Encounter: Admitting: Family Medicine
